# Patient Record
Sex: Female | Born: 1990 | Race: Black or African American | Hispanic: No | Marital: Married | Smoking: Never smoker
Health system: Southern US, Community
[De-identification: ages and names within clinical notes are randomized; demographics above are authoritative.]

## PROBLEM LIST (undated history)

## (undated) ENCOUNTER — Ambulatory Visit (HOSPITAL_COMMUNITY)

## (undated) DIAGNOSIS — K623 Rectal prolapse: Secondary | ICD-10-CM

## (undated) DIAGNOSIS — K602 Anal fissure, unspecified: Secondary | ICD-10-CM

## (undated) DIAGNOSIS — N979 Female infertility, unspecified: Secondary | ICD-10-CM

## (undated) DIAGNOSIS — Z8619 Personal history of other infectious and parasitic diseases: Secondary | ICD-10-CM

## (undated) DIAGNOSIS — I10 Essential (primary) hypertension: Secondary | ICD-10-CM

## (undated) HISTORY — DX: Essential (primary) hypertension: I10

## (undated) HISTORY — PX: RECTAL SURGERY: SHX760

## (undated) HISTORY — DX: Personal history of other infectious and parasitic diseases: Z86.19

## (undated) HISTORY — DX: Female infertility, unspecified: N97.9

---

## 2013-01-12 HISTORY — PX: MINOR HEMORRHOIDECTOMY: SHX6238

## 2013-01-20 DIAGNOSIS — K645 Perianal venous thrombosis: Secondary | ICD-10-CM | POA: Insufficient documentation

## 2017-03-15 ENCOUNTER — Emergency Department (HOSPITAL_COMMUNITY)
Admission: EM | Admit: 2017-03-15 | Discharge: 2017-03-15 | Disposition: A | Discharge status: Home / Self Care | Payer: Self-pay | Attending: Emergency Medicine | Admitting: Emergency Medicine

## 2017-03-15 ENCOUNTER — Encounter (HOSPITAL_COMMUNITY): Payer: Self-pay | Admitting: Emergency Medicine

## 2017-03-15 ENCOUNTER — Emergency Department (HOSPITAL_COMMUNITY): Payer: Self-pay

## 2017-03-15 ENCOUNTER — Other Ambulatory Visit: Payer: Self-pay

## 2017-03-15 DIAGNOSIS — K5900 Constipation, unspecified: Secondary | ICD-10-CM | POA: Insufficient documentation

## 2017-03-15 DIAGNOSIS — K602 Anal fissure, unspecified: Secondary | ICD-10-CM | POA: Insufficient documentation

## 2017-03-15 HISTORY — DX: Rectal prolapse: K62.3

## 2017-03-15 HISTORY — DX: Anal fissure, unspecified: K60.2

## 2017-03-15 LAB — COMPREHENSIVE METABOLIC PANEL
ALT: 15 U/L (ref 14–54)
AST: 18 U/L (ref 15–41)
Albumin: 3.7 g/dL (ref 3.5–5.0)
Alkaline Phosphatase: 54 U/L (ref 38–126)
Anion gap: 6 (ref 5–15)
BUN: <5 mg/dL — ABNORMAL LOW (ref 6–20)
CO2: 27 mmol/L (ref 22–32)
Calcium: 9.2 mg/dL (ref 8.9–10.3)
Chloride: 106 mmol/L (ref 101–111)
Creatinine, Ser: 0.82 mg/dL (ref 0.44–1.00)
GFR calc Af Amer: >60 mL/min (ref >60)
GFR calc non Af Amer: >60 mL/min (ref >60)
Glucose, Bld: 98 mg/dL (ref 65–99)
Potassium: 4 mmol/L (ref 3.5–5.1)
Sodium: 139 mmol/L (ref 135–145)
Total Bilirubin: 0.7 mg/dL (ref 0.3–1.2)
Total Protein: 6.7 g/dL (ref 6.5–8.1)

## 2017-03-15 LAB — CBC
HCT: 36.5 % (ref 36.0–46.0)
Hemoglobin: 12.4 g/dL (ref 12.0–15.0)
MCH: 31.6 pg (ref 26.0–34.0)
MCHC: 34 g/dL (ref 30.0–36.0)
MCV: 92.9 fL (ref 78.0–100.0)
Platelets: 243 10*3/uL (ref 150–400)
RBC: 3.93 MIL/uL (ref 3.87–5.11)
RDW: 12.1 % (ref 11.5–15.5)
WBC: 9 10*3/uL (ref 4.0–10.5)

## 2017-03-15 LAB — I-STAT BETA HCG BLOOD, ED (MC, WL, AP ONLY): I-stat hCG, quantitative: <5 m[IU]/mL (ref <5)

## 2017-03-15 MED ORDER — HYDROCORTISONE ACETATE 25 MG RE SUPP
25.0000 mg | Freq: Once | RECTAL | Status: AC
  Administered 2017-03-15: 25 mg via RECTAL
  Filled 2017-03-15: qty 1

## 2017-03-15 MED ORDER — SODIUM CHLORIDE 0.9 % IV BOLUS (SEPSIS)
1000.0000 mL | Freq: Once | INTRAVENOUS | Status: AC
  Administered 2017-03-15: 1000 mL via INTRAVENOUS

## 2017-03-15 MED ORDER — HYDROCORTISONE 2.5 % RE CREA: TOPICAL_CREAM | RECTAL | 1 refills | Status: AC

## 2017-03-15 MED ORDER — OXYCODONE-ACETAMINOPHEN 5-325 MG PO TABS
1.0000 | ORAL_TABLET | Freq: Once | ORAL | Status: AC
  Administered 2017-03-15: 1 via ORAL
  Filled 2017-03-15: qty 1

## 2017-03-15 MED ORDER — POLYETHYLENE GLYCOL 3350 17 G PO PACK: 17.0000 g | PACK | Freq: Every day | ORAL | 0 refills | Status: DC

## 2017-03-15 MED ORDER — MORPHINE SULFATE (PF) 4 MG/ML IV SOLN
4.0000 mg | Freq: Once | INTRAVENOUS | Status: AC
  Administered 2017-03-15: 4 mg via INTRAVENOUS
  Filled 2017-03-15: qty 1

## 2017-03-15 MED ORDER — TRAMADOL HCL 50 MG PO TABS: 50.0000 mg | ORAL_TABLET | Freq: Four times a day (QID) | ORAL | 0 refills | Status: AC | PRN

## 2017-03-15 MED ORDER — DOCUSATE SODIUM 100 MG PO CAPS
100.0000 mg | ORAL_CAPSULE | Freq: Once | ORAL | Status: AC
  Administered 2017-03-15: 100 mg via ORAL
  Filled 2017-03-15: qty 1

## 2017-03-15 NOTE — ED Notes (Signed)
Called radiology to follow up on xray, X-ray department states patient is near top of list to go for imagine.

## 2017-03-15 NOTE — ED Provider Notes (Signed)
[facility]   CSN: [id number] Arrival date & time: 03/15/17  0108    History   Chief Complaint Chief Complaint  Patient presents with  . Abdominal Pain  . Rectal Pain   HPI  Michelle Garrison is a 26 y.o. female presenting with abdominal and rectal pain.   She reports she had a hard time going to the bathroom yesterday around 10PM and took some magnesium.   Started having rectal bleeding which was painful and dark red, looked like clots.  Abdominal pain was sudden onset, localized to central-lower abdominal pain.  Pain does not radiate, is sharp in nature and 10/10 in severity.   Reports she is sometimes constipated and has BMs every 2-3 days.   Has not noted blood in her stool or on toilet tissue.  No difficulty urinating.   Denies fevers, chills, n/v, pelvic pain, no diarrhea.  Denies trauma to the rectum however has had this occurred once in the past about 3-4 years after she had rectal prolapse surgery.    Past Medical History:  Diagnosis Date  . Anal fissure   . Rectal prolapse    There are no active problems to display for this patient.  Past Surgical History:  Procedure Laterality Date  . RECTAL SURGERY     OB History    No data available     Home Medications    Prior to Admission medications   Not on File    Family History No family history on file.  Social History Social History   Tobacco Use  . Smoking status: Never Smoker  . Smokeless tobacco: Never Used  Substance Use Topics  . Alcohol use: No    Frequency: Never  . Drug use: No   Allergies   Patient has no known allergies.  Review of Systems Review of Systems  Constitutional: Negative for fatigue, fever and unexpected weight change.  Respiratory: Negative for cough, chest tightness, shortness of breath and wheezing.   Cardiovascular: Negative for chest pain, palpitations and leg swelling.  Gastrointestinal: Positive for abdominal pain, anal  bleeding, constipation and rectal pain. Negative for blood in stool, diarrhea and nausea.  Skin: Negative for rash.   Physical Exam Updated Vital Signs BP 123/83   Pulse 71   Temp 98.6 F (37 C)   Resp 16   Ht 5\' 4"  (1.626 m)   Wt 56.7 kg (125 lb)   LMP 03/01/2017 (Exact Date)   SpO2 98%   BMI 21.46 kg/m   Physical Exam Gen-26 year old female, mild distress, laying on her right side Skin -warm, dry HEENT- EOMI, moist mucous membranes, oropharynx is clear Neck - supple, no significant adenopathy Chest -clear to auscultation bilaterally, work of breathing is normal Heart -  RRR no MRG Abdomen -soft, nondistended, TTP over right and left lower quadrants, no rebound or guarding present, positive bowel sounds Rectal:  No evidence of active bleed or drainage from rectum,  No skin tears, abrasions or laceration noted, external hemorrhoid present, normal sphincter tone  Musculoskeletal -no edema  Neuro -awake, alert, oriented x3, no focal deficits  ED Treatments / Results  Labs (all labs ordered are listed, but only abnormal results are displayed) Labs Reviewed  COMPREHENSIVE METABOLIC PANEL - Abnormal; Notable for the following components:      Result Value   BUN <5 (*)    All other components within normal limits  CBC  I-STAT BETA HCG BLOOD, ED ([facility], [facility], [facility] ONLY)  EKG  EKG Interpretation None      Radiology Dg Abd Acute W/chest  Result Date: 03/15/2017 LOWER QUADRANT CLINICAL DATA: Pain associated with constipation for the past 2 days. History of rectal bleeding and rectal prolapse in the past. EXAM: DG ABDOMEN ACUTE W/ 1V CHEST COMPARISON:  None in PACs FINDINGS: The lungs are well-expanded and clear. The heart and mediastinal structures are normal. There is no pleural effusion. Within the abdomen there are numerous small air-fluid levels in both small and large bowel. No free extraluminal gas is observed. There is a moderate amount of stool in the rectum. There are no  abnormal soft tissue calcifications. The bony structures are unremarkable. IMPRESSION: No acute cardiopulmonary abnormality. Ileus versus mild distal colonic obstruction.  No perforation. Electronically Signed   By: David  SwazilandJordan M.D.   On: 03/15/2017 11:07    Procedures Procedures (including critical care time)  Medications Ordered in ED Medications  oxyCODONE-acetaminophen (PERCOCET/ROXICET) 5-325 MG per tablet 1 tablet (1 tablet Oral Given 03/15/17 0155)  sodium chloride 0.9 % bolus 1,000 mL (0 mLs Intravenous Stopped 03/15/17 1117)  morphine 4 MG/ML injection 4 mg (4 mg Intravenous Given 03/15/17 0908)  hydrocortisone (ANUSOL-HC) suppository 25 mg (25 mg Rectal Given 03/15/17 0908)  docusate sodium (COLACE) capsule 100 mg (100 mg Oral Given 03/15/17 1142)   Initial Impression / Assessment and Plan / ED Course  I have reviewed the triage vital signs and the nursing notes.  Pertinent labs & imaging results that were available during my care of the patient were reviewed by me and considered in my medical decision making (see chart for details).  26 year old female presenting with rectal pain and dark red bleeding in addition to lower abdominal pain and cramping.   In ED given Percocet for pain however still in a tremendous amount of pain. NS IVF and morphine started and Anusol for pain relief.  istat bhcg negative.   Unlikely rectal abscess given afebrile and white count within normal limits.  Labs unremarkable with stable hemoglobin.  Suspect her rectal pain is secondary to constipation vs anal fissure.  Chest and abdominal XR did not show any free gas or evidence of perforation.  There is a moderate amount of stool in the rectum.  She was given Colace in the ED for constipation and patient reported her symptoms slowly improving.   -Patient was sent home in stable condition with prescription for MiraLAX and Anusol -Tramadol tablets given for pain -Return precautions discussed  Final  Clinical Impressions(s) / ED Diagnoses   Final diagnoses:  None   ED Discharge Orders    None     Freddrick MarchYashika Olivya Sobol, MD [facility], PGY-2     Freddrick MarchAmin, Tamura Lasky, MD 03/15/17 1238    Charlynne PanderYao, David Hsienta, MD 03/17/17 1500

## 2017-03-15 NOTE — ED Triage Notes (Signed)
Pt c/o rectal pain and constipation.  Pt st's she has had a rectal prolapse before and this feels the same.  Pt also st's bleeding coming from rectum

## 2017-03-15 NOTE — Discharge Instructions (Signed)
You were seen in the ER for rectal pain and bleeding which is most likely due to constipation.  You were given medication for pain while here.  We ordered some imaging on your belly which showed stool in your colon possibly causing her symptoms.    I have prescribed some tramadol and Anusol for your pain.  Additionally you will need to use MiraLAX daily to help with bowel movements.   If you have new or worsening symptoms you will need to return to be evaluated.

## 2019-02-21 ENCOUNTER — Encounter: Payer: Self-pay | Admitting: Obstetrics and Gynecology

## 2019-02-21 ENCOUNTER — Ambulatory Visit (INDEPENDENT_AMBULATORY_CARE_PROVIDER_SITE_OTHER): Payer: Self-pay | Admitting: Obstetrics and Gynecology

## 2019-02-21 ENCOUNTER — Other Ambulatory Visit: Payer: Self-pay

## 2019-02-21 VITALS — BP 156/112 | HR 67 | Wt 151.0 lb

## 2019-02-21 DIAGNOSIS — I10 Essential (primary) hypertension: Secondary | ICD-10-CM

## 2019-02-21 MED ORDER — AMLODIPINE BESYLATE 10 MG PO TABS: 10.0000 mg | ORAL_TABLET | Freq: Every day | ORAL | 1 refills | Status: DC

## 2019-02-21 NOTE — Progress Notes (Signed)
Attempting to conceive for a year or 2

## 2019-02-21 NOTE — Progress Notes (Deleted)
GYNECOLOGY ANNUAL PREVENTATIVE CARE ENCOUNTER NOTE  History:     Michelle Garrison is a 28 y.o. No obstetric history on file. female here for a routine annual gynecologic exam.  Current complaints: unable to conceive.    Denies abnormal vaginal bleeding, discharge, pelvic pain, problems with intercourse or other gynecologic concerns.    Has a period every month, however periods are irregular. Comes once a month however could come every 20 days to every 35 days. At times she is 2-3 months late. Has been trying to conceive for 2 years. Has regular intercourse. Has no pregnancies.    Gynecologic History No LMP recorded. (Menstrual status: Irregular Periods). Contraception: {method:5051} Last Pap: ***. Results were: {norm/abn:16337} with negative HPV Last mammogram: ***. Results were: {norm/abn:16337}  Obstetric History OB History  No obstetric history on file.    Past Medical History:  Diagnosis Date  . Anal fissure   . Rectal prolapse     Past Surgical History:  Procedure Laterality Date  . RECTAL SURGERY      Current Outpatient Medications on File Prior to Visit  Medication Sig Dispense Refill  . polyethylene glycol (MIRALAX) packet Take 17 g daily by mouth. (Patient not taking: Reported on 02/21/2019) 14 each 0   No current facility-administered medications on file prior to visit.     No Known Allergies  Social History:  reports that she has never smoked. She has never used smokeless tobacco. She reports that she does not drink alcohol or use drugs.  History reviewed. No pertinent family history.  The following portions of the patient's history were reviewed and updated as appropriate: allergies, current medications, past family history, past medical history, past social history, past surgical history and problem list.  Review of Systems Pertinent items noted in HPI and remainder of comprehensive ROS otherwise negative.  Physical Exam:  BP (!) 156/112   Pulse 67    Wt 151 lb (68.5 kg)   BMI 25.92 kg/m  CONSTITUTIONAL: Well-developed, well-nourished female in no acute distress.  HENT:  Normocephalic, atraumatic, External right and left ear normal. Oropharynx is clear and moist EYES: Conjunctivae and EOM are normal. Pupils are equal, round, and reactive to light. No scleral icterus.  NECK: Normal range of motion, supple, no masses.  Normal thyroid.  SKIN: Skin is warm and dry. No rash noted. Not diaphoretic. No erythema. No pallor. MUSCULOSKELETAL: Normal range of motion. No tenderness.  No cyanosis, clubbing, or edema.  2+ distal pulses. NEUROLOGIC: Alert and oriented to person, place, and time. Normal reflexes, muscle tone coordination. No cranial nerve deficit noted. PSYCHIATRIC: Normal mood and affect. Normal behavior. Normal judgment and thought content. CARDIOVASCULAR: Normal heart rate noted, regular rhythm RESPIRATORY: Clear to auscultation bilaterally. Effort and breath sounds normal, no problems with respiration noted. BREASTS: Symmetric in size. No masses, skin changes, nipple drainage, or lymphadenopathy. ABDOMEN: Soft, normal bowel sounds, no distention noted.  No tenderness, rebound or guarding.  PELVIC: Normal appearing external genitalia; normal appearing vaginal mucosa and cervix.  No abnormal discharge noted.  Pap smear obtained.  Normal uterine size, no other palpable masses, no uterine or adnexal tenderness.   Assessment and Plan:    There are no diagnoses linked to this encounter. Will follow up results of pap smear and manage accordingly. Mammogram scheduled Routine preventative health maintenance measures emphasized. Please refer to After Visit Summary for other counseling recommendations.      Verita Schneiders, MD, [facility], [facility]

## 2019-02-21 NOTE — Progress Notes (Signed)
Patient here to discuss infertility. She is a new patient. She has no health insurance and plans to pay out of pocket She is not due for a pap smear or annual BP elevated 156/112. Patient with multiple episodes of elevated BP per patient. No PCP.  Patient referred to Dr. Annice Needy aware of the costs.  Norvasc RX: 10 mg daily She needs a PCP  No labs collected  GENERAL: Well-developed, well-nourished female in no acute distress.  LUNGS: Effort normal SKIN: Warm, dry and without erythema PSYCH: Normal mood and affect   Michelle Garrison, Artist Pais, NP 02/21/2019 3:29 PM

## 2019-04-25 ENCOUNTER — Emergency Department (HOSPITAL_COMMUNITY)
Admission: EM | Admit: 2019-04-25 | Discharge: 2019-04-25 | Disposition: A | Discharge status: Home / Self Care | Payer: Self-pay | Attending: Emergency Medicine | Admitting: Emergency Medicine

## 2019-04-25 ENCOUNTER — Ambulatory Visit (HOSPITAL_COMMUNITY)
Admission: EM | Admit: 2019-04-25 | Discharge: 2019-04-25 | Disposition: A | Discharge status: Home / Self Care | Payer: Self-pay | Attending: Family Medicine | Admitting: Family Medicine

## 2019-04-25 ENCOUNTER — Emergency Department (HOSPITAL_COMMUNITY): Payer: Self-pay

## 2019-04-25 ENCOUNTER — Encounter (HOSPITAL_COMMUNITY): Payer: Self-pay

## 2019-04-25 ENCOUNTER — Other Ambulatory Visit: Payer: Self-pay

## 2019-04-25 DIAGNOSIS — R0603 Acute respiratory distress: Secondary | ICD-10-CM

## 2019-04-25 DIAGNOSIS — Z79899 Other long term (current) drug therapy: Secondary | ICD-10-CM | POA: Insufficient documentation

## 2019-04-25 DIAGNOSIS — R0600 Dyspnea, unspecified: Secondary | ICD-10-CM

## 2019-04-25 DIAGNOSIS — R0602 Shortness of breath: Secondary | ICD-10-CM | POA: Insufficient documentation

## 2019-04-25 DIAGNOSIS — I10 Essential (primary) hypertension: Secondary | ICD-10-CM | POA: Insufficient documentation

## 2019-04-25 DIAGNOSIS — R0781 Pleurodynia: Secondary | ICD-10-CM | POA: Insufficient documentation

## 2019-04-25 DIAGNOSIS — R071 Chest pain on breathing: Secondary | ICD-10-CM

## 2019-04-25 LAB — COMPREHENSIVE METABOLIC PANEL
ALT: 18 U/L (ref 0–44)
AST: 18 U/L (ref 15–41)
Albumin: 4.1 g/dL (ref 3.5–5.0)
Alkaline Phosphatase: 46 U/L (ref 38–126)
Anion gap: 9 (ref 5–15)
BUN: 6 mg/dL (ref 6–20)
CO2: 21 mmol/L — ABNORMAL LOW (ref 22–32)
Calcium: 8.7 mg/dL — ABNORMAL LOW (ref 8.9–10.3)
Chloride: 108 mmol/L (ref 98–111)
Creatinine, Ser: 0.86 mg/dL (ref 0.44–1.00)
GFR calc Af Amer: >60 mL/min (ref >60)
GFR calc non Af Amer: >60 mL/min (ref >60)
Glucose, Bld: 103 mg/dL — ABNORMAL HIGH (ref 70–99)
Potassium: 4.2 mmol/L (ref 3.5–5.1)
Sodium: 138 mmol/L (ref 135–145)
Total Bilirubin: 0.6 mg/dL (ref 0.3–1.2)
Total Protein: 6.8 g/dL (ref 6.5–8.1)

## 2019-04-25 LAB — LIPASE, BLOOD: Lipase: 28 U/L (ref 11–51)

## 2019-04-25 LAB — CBC
HCT: 36.5 % (ref 36.0–46.0)
Hemoglobin: 12.6 g/dL (ref 12.0–15.0)
MCH: 32.2 pg (ref 26.0–34.0)
MCHC: 34.5 g/dL (ref 30.0–36.0)
MCV: 93.4 fL (ref 80.0–100.0)
Platelets: 239 10*3/uL (ref 150–400)
RBC: 3.91 MIL/uL (ref 3.87–5.11)
RDW: 12.4 % (ref 11.5–15.5)
WBC: 6 10*3/uL (ref 4.0–10.5)
nRBC: 0 % (ref 0.0–0.2)

## 2019-04-25 LAB — TROPONIN I (HIGH SENSITIVITY): Troponin I (High Sensitivity): <2 ng/L (ref <18)

## 2019-04-25 LAB — I-STAT BETA HCG BLOOD, ED (MC, WL, AP ONLY): I-stat hCG, quantitative: <5 m[IU]/mL (ref <5)

## 2019-04-25 MED ORDER — METHOCARBAMOL 500 MG PO TABS: 500.0000 mg | ORAL_TABLET | Freq: Two times a day (BID) | ORAL | 0 refills | Status: DC

## 2019-04-25 MED ORDER — IOHEXOL 350 MG/ML SOLN
100.0000 mL | Freq: Once | INTRAVENOUS | Status: AC | PRN
  Administered 2019-04-25: 100 mL via INTRAVENOUS

## 2019-04-25 MED ORDER — METHOCARBAMOL 500 MG PO TABS
750.0000 mg | ORAL_TABLET | Freq: Once | ORAL | Status: AC
  Administered 2019-04-25: 18:00:00 750 mg via ORAL
  Filled 2019-04-25: qty 2

## 2019-04-25 MED ORDER — SODIUM CHLORIDE 0.9 % IV BOLUS
1000.0000 mL | Freq: Once | INTRAVENOUS | Status: AC
  Administered 2019-04-25: 1000 mL via INTRAVENOUS

## 2019-04-25 MED ORDER — MORPHINE SULFATE (PF) 4 MG/ML IV SOLN
4.0000 mg | Freq: Once | INTRAVENOUS | Status: AC
  Administered 2019-04-25: 4 mg via INTRAVENOUS
  Filled 2019-04-25: qty 1

## 2019-04-25 NOTE — ED Triage Notes (Signed)
Pt from UC with ems for evaluation of acute onset SOB and right flank pain. Pain worse with deep inspiration. UC believes she may have a possible pneumothorax or PE. Pt alert, resp unlabored at this time

## 2019-04-25 NOTE — ED Notes (Signed)
Patient is being discharged from the Urgent [place] and sent to the Emergency Department via [place]. Per Molli Barrows, FNP, patient is stable but in need of higher level of care due to sudden onset severe right sided chest/abdominal pain that is worse w/ inspiration. Concern for PE. Patient is aware and verbalizes understanding of plan of care. Pt also states there is a chance she could be pregnant, pt only able to sit in tripod position due to severe pain. Vitals:   04/25/19 1357 04/25/19 1405  BP: (!) 160/113   Pulse: 88 97  Resp: (!) 26   Temp: 98.6 F (37 C)   SpO2: 100% 95%

## 2019-04-25 NOTE — ED Provider Notes (Addendum)
[facility] EMERGENCY DEPARTMENT Provider Note   CSN: [id number] Arrival date & time: 04/25/19  1441     History Chief Complaint  Patient presents with  . Chest Pain  . Shortness of Breath  . Pleurisy    Michelle Garrison is a 28 y.o. female with no significant past medical history other than hypertension treated with amlodipine  HPI Patient is 28 year old female presented today with acute onset of shortness of breath and chest pain that is pleuritic, nonradiating, severe, 10/10, worse with deep breaths and leaning forward.  Patient denies any symptoms prior to the acute onset this morning.  States that began around 12:45 PM when she suddenly couch.  States the pain is well localized and sharp directly underneath her right breast.  States that it hurts to push on the area but does not recreate the same pain.  Patient denies any diaphoresis, lightheadedness, nausea, abdominal pain.  Patient has any recent illness cough, fever, congestion.  States she was feeling quite well prior to episode.  States that her dyspnea has improved slightly but she still has pleuritic pain.  Patient was seen in urgent care and sent to ED for concern for PE versus pneumothorax.  Patient has a history of clots, no hemoptysis, no active cancer, no leg swelling, no leg tenderness.  Patient is not diabetic, has treated hypertension, non-smoker, no family with heart attacks under 32 years old.   HPI: A 28 year old patient with a history of hypertension presents for evaluation of chest pain. Initial onset of pain was approximately 3-6 hours ago. The patient's chest pain is well-localized, is sharp and is not worse with exertion. The patient's chest pain is not middle- or left-sided, is not described as heaviness/pressure/tightness and does not radiate to the arms/jaw/neck. The patient does not complain of nausea and denies diaphoresis. The patient has no history of stroke, has no history of peripheral  artery disease, has not smoked in the past 90 days, denies any history of treated diabetes, has no relevant family history of coronary artery disease (first degree relative at less than age 55), has no history of hypercholesterolemia and does not have an elevated BMI (>=30).   Past Medical History:  Diagnosis Date  . Anal fissure   . Rectal prolapse     There are no problems to display for this patient.   Past Surgical History:  Procedure Laterality Date  . RECTAL SURGERY       OB History   No obstetric history on file.     Family History  Problem Relation Age of Onset  . Diabetes Mother   . Healthy Father     Social History   Tobacco Use  . Smoking status: Never Smoker  . Smokeless tobacco: Never Used  Substance Use Topics  . Alcohol use: No  . Drug use: No    Home Medications Prior to Admission medications   Medication Sig Start Date End Date Taking? Authorizing Provider  amLODipine (NORVASC) 10 MG tablet Take 1 tablet (10 mg total) by mouth daily. 02/21/19   Rasch, Victorino Dike I, NP  polyethylene glycol (MIRALAX) packet Take 17 g daily by mouth. Patient not taking: Reported on 02/21/2019 03/15/17   Freddrick March, MD    Allergies    Patient has no known allergies.  Review of Systems   Review of Systems  Constitutional: Negative for chills and fever.  HENT: Negative for congestion.   Eyes: Negative for pain.  Respiratory: Positive for shortness of breath.  Negative for cough and chest tightness.   Cardiovascular: Positive for chest pain. Negative for leg swelling.  Gastrointestinal: Negative for abdominal pain and vomiting.  Genitourinary: Negative for dysuria.  Musculoskeletal: Negative for myalgias.  Skin: Negative for rash.  Neurological: Negative for dizziness and headaches.    Physical Exam Updated Vital Signs BP (!) 159/99   Pulse 78   Temp 98.6 F (37 C) (Oral)   Resp 20   SpO2 100%   Physical Exam Vitals and nursing note reviewed.   Constitutional:      General: She is not in acute distress. HENT:     Head: Normocephalic and atraumatic.     Nose: Nose normal.  Eyes:     General: No scleral icterus. Cardiovascular:     Rate and Rhythm: Normal rate and regular rhythm.     Pulses: Normal pulses.     Heart sounds: Normal heart sounds.  Pulmonary:     Effort: Pulmonary effort is normal. No respiratory distress.     Breath sounds: No wheezing.     Comments: Patient respiratory rate of 24.  No increased work of breathing.  Speaking in full sentences.  Tidal volume grossly shallow. Abdominal:     Palpations: Abdomen is soft.     Tenderness: There is no abdominal tenderness.  Musculoskeletal:     Cervical back: Normal range of motion.     Right lower leg: No edema.     Left lower leg: No edema.     Comments: No reproducible chest wall tenderness to palpation  No leg swelling.  No tenderness to palpation of calves.  Negative Homans' sign bilaterally.   Skin:    General: Skin is warm and dry.     Capillary Refill: Capillary refill takes less than 2 seconds.  Neurological:     Mental Status: She is alert. Mental status is at baseline.     Motor: No weakness.     Comments: Moves all 4 limbs symmetric strength  Psychiatric:        Mood and Affect: Mood normal.        Behavior: Behavior normal.     ED Results / Procedures / Treatments   Labs (all labs ordered are listed, but only abnormal results are displayed) Labs Reviewed  COMPREHENSIVE METABOLIC PANEL - Abnormal; Notable for the following components:      Result Value   CO2 21 (*)    Glucose, Bld 103 (*)    Calcium 8.7 (*)    All other components within normal limits  LIPASE, BLOOD  CBC  I-STAT BETA HCG BLOOD, ED ([facility], [facility], [facility] ONLY)  TROPONIN I (HIGH SENSITIVITY)    EKG None  ED ECG REPORT   Date: 04/25/2019  Rate: 78 bpm  Rhythm: normal sinus rhythm  QRS Axis: normal  Intervals: normal  ST/T Wave abnormalities: nonspecific T wave changes   Conduction Disutrbances:none  Narrative Interpretation:   Old EKG Reviewed: none available  I have personally reviewed the EKG tracing and agree with the computerized printout as noted.   Radiology DG Chest Portable 1 View  Result Date: 04/25/2019 CLINICAL DATA:  Shortness of breath, chest pain EXAM: PORTABLE CHEST 1 VIEW COMPARISON:  Portable exam 1501 hours without priors for comparison FINDINGS: Normal heart size, mediastinal contours, and pulmonary vascularity. Lungs clear. No pleural effusion or pneumothorax. Bones unremarkable. IMPRESSION: Normal exam. Electronically Signed   By: Ulyses Southward M.D.   On: 04/25/2019 15:17    Procedures Procedures (including critical  care time)  Medications Ordered in ED Medications  morphine 4 MG/ML injection 4 mg (has no administration in time range)  methocarbamol (ROBAXIN) tablet 750 mg (750 mg Oral Given 04/25/19 1827)    ED Course  I have reviewed the triage vital signs and the nursing notes.  Pertinent labs & imaging results that were available during my care of the patient were reviewed by me and considered in my medical decision making (see chart for details).  Clinical Course as of Apr 25 1911  Thu Apr 25, 2019  1741 Chest x-ray personally viewed by myself no evidence of pneumothorax, no focal infiltrate, no Hampton hump  DG Chest Portable 1 View [WF]    Clinical Course User Index [WF] Tedd Sias, [place]   MDM Rules/Calculators/A&P HEAR Score: 2                    Patient is a healthy 28 year old female presented today with acute onset of pleuritic chest pain that is nonradiating, right-sided, constant, worse with breathing and leaning forward.  Physical exam unremarkable.  Given history of physical exam primarily concern for muscular strain, pericarditis or pulmonary embolism.  Patient is moderate risk Wells criteria due to pulmonary embolism being #1 diagnosis or equally likely to alternative diagnosis.  EKG shows no  indications of pericarditis and patient does not have any recent history of URI/viral illness.  hCG negative.  CBC within normal limits.  Lipase within normal limits.  CMP remarkable for minimally decreased CO2 consistent with mild tachypnea. Troponin x1 negative as it has been >6 hours and hsTrop is negative with HEART score < 3 will not order repeat troponin per CP algorithm.   EKG personally reviewed by myself.  Regular rate and rhythm, no PR shortening or lengthening.  There are normal intervals.  Normal axis.  Normal R wave progression.  No ST elevation, PR depression, or acute ischemic changes.  No reference EKG to compare to.  Isolated T wave inversions in V1 likely normal variant but could be indicative of right heart strain.  Chest x-ray personally reviewed myself shows no focal infiltrate, pneumothorax, no Hampton hump.  No indication of acute abnormality.  Primary concern is pulmonary embolism for this reason and due to moderate Wells criteria will obtain pulmonary embolism CT scan.  Shared decision making conversation with patient discussing using a D-dimer versus pulmonary embolism CT scan.  Patient preferred to have definitive test done today as she is very concerned for blood clot.  On reevaluation patient is still experiencing 10/10 CP with breathing. Will give 4mg  of morphine while waiting for CT scan.   The emergent causes of chest pain include: Acute coronary syndrome, tamponade, pericarditis/myocarditis, aortic dissection, pulmonary embolism, tension pneumothorax, pneumonia, and esophageal rupture.  I do not believe the patient has an emergent cause of chest pain, other urgent/non-acute considerations include, but are not limited to: chronic angina, aortic stenosis, cardiomyopathy, mitral valve prolapse, pulmonary hypertension, aortic insufficiency, right ventricular hypertrophy, pleuritis, bronchitis, pneumothorax, tumor, gastroesophageal reflux disease (GERD), esophageal spasm,  Mallory-Weiss syndrome, peptic ulcer disease, pancreatitis, functional gastrointestinal pain, cervical or thoracic disk disease or arthritis, shoulder arthritis, costochondritis, subacromial bursitis, anxiety or panic attack, herpes zoster, breast disorders, chest wall tumors, thoracic outlet syndrome, mediastinitis.  As this is likely musculoskeletal pain I counseled patient on using Robaxin and recommended Tylenol ibuprofen as well as rest relax and take warm salt water soaks.  Will recommend gentle stretching as well.  Patient given strict return precautions.  CT scan pending at time of shift change. 7:12 PM patient signed out to Terance HartKelly Gekas, PA-C who will reevaluate patient after CTPA and appropriately dispo.    Final Clinical Impression(s) / ED Diagnoses Final diagnoses:  Pleuritic chest pain    Rx / DC Orders ED Discharge Orders    None       Gailen ShelterFondaw, Aundra Espin S, [place] 04/25/19 1912    Gailen ShelterFondaw, Deneice Wack S, [place] 04/25/19 Jacqlyn [id number]    Haviland, Julie, MD 04/25/19 1941

## 2019-04-25 NOTE — Discharge Instructions (Signed)
Your examination today is most concerning for a muscular injury 1. Medications: alternate ibuprofen and tylenol for pain control, take all usual home medications as they are prescribed 2. Follow Up: If your symptoms do not improve please follow up with orthopedics/sports medicine or your PCP for discussion of your diagnoses and further evaluation after today's visit; if you do not have a primary care doctor use the resource guide provided to find one; Please return to the ER for worsening symptoms or other concerns.    Please return to ED if you have any new concerning symptoms.

## 2019-04-25 NOTE — ED Notes (Signed)
Patient transported to CT 

## 2019-04-25 NOTE — ED Provider Notes (Signed)
28 year old female with acute onset of CP/SOB. R sided and constant since this morning. At shift change CTA chest is pending.  CTA chest is negative. Work up reviewed. EKG is SR. Labs are reassuring. Trop is normal. CXR is negative. Will treat as MSK pain. Rx for Robaxin given.   Recardo Evangelist, PA-C 04/25/19 2042    Margette Fast, MD 04/26/19 [phone]

## 2019-04-25 NOTE — ED Triage Notes (Addendum)
Patient presents to Urgent Care with complaints of sudden and severe right abdominal pain from her breast down to her pelvis since about 30 mins ago. Patient reports this has not happened in the past, no cardiac hx, pt is barely able to stand to transfer from wheelchair to exam table. BP 160/113, HR 99, O2 100, temp 98.6.  Pt has not eaten today, also states she has not taken her BP medication in about a week. Pt's pain is 10/10 during deep inspiration, APP bedside for eval, recommending transport to ED for eval of possible pneumothorax.

## 2019-04-25 NOTE — ED Notes (Signed)
No answer for room x2 

## 2019-04-25 NOTE — ED Notes (Signed)
Pt was in restroom when called

## 2019-04-25 NOTE — ED Notes (Signed)
Patient verbalizes understanding of discharge instructions. Opportunity for questioning and answers were provided. Armband removed by staff, pt discharged from ED ambulatory to home.  

## 2019-04-25 NOTE — ED Provider Notes (Signed)
[place]    CSN: [id number] Arrival date & time: 04/25/19  1350      History   Chief Complaint Chief Complaint  Patient presents with   Chest Pain (Right)  . Shortness of Breath    HPI Michelle Garrison is a 28 y.o. female.   HPI Patient presents to urgent care with acute onset of right sided chest pain which she reports occurred suddenly approximately 30-40 minutes ago. Current pain level she reports is a 10/10 pain with inspiratory breathing or leaning back. Pain improves with flexing forward. She denies coughing or recent illness. Medical history is only significant for hypertension and she has not taken blood pressure medication in about 1 week. Prior to 30 minutes ago, patient was well and denies any concerning symptoms. Patient is shaking uncontrollable and continues to state " I can't breath". Oxygen level is normal and briefly desaturated into upper 60% although immediately rebounded to mid 90% as patient appeared to be holding breath as she was complaining of severe pain. No history of clotting disorder or prior PE. Pt reports possible pregnancy as menstrual cycle is late. No history of complicated pregnancy.Patient is tachpyneic and endorses feeling as if she is going to pass out. EMS has been called. Provider is at bedside and RN starting IV. Patient placed on 2 liters of oxygen, ECG NSR without present ST Elevation or depression.   Past Medical History:  Diagnosis Date  . Anal fissure   . Rectal prolapse     There are no problems to display for this patient.   Past Surgical History:  Procedure Laterality Date  . RECTAL SURGERY      OB History   No obstetric history on file.      Home Medications    Prior to Admission medications   Medication Sig Start Date End Date Taking? Authorizing Provider  amLODipine (NORVASC) 10 MG tablet Take 1 tablet (10 mg total) by mouth daily. 02/21/19  Yes Rasch, Anderson Malta I, NP  polyethylene glycol (MIRALAX)  packet Take 17 g daily by mouth. Patient not taking: Reported on 02/21/2019 03/15/17   Lovenia Kim, MD    Family History Family History  Problem Relation Age of Onset  . Diabetes Mother   . Healthy Father     Social History Social History   Tobacco Use  . Smoking status: Never Smoker  . Smokeless tobacco: Never Used  Substance Use Topics  . Alcohol use: No  . Drug use: No     Allergies   Patient has no known allergies.   Review of Systems Review of Systems Pertinent negatives listed in HPI  Physical Exam Triage Vital Signs ED Triage Vitals  Enc Vitals Group     BP 04/25/19 1357 (!) 160/113     Pulse Rate 04/25/19 1357 88     Resp 04/25/19 1357 (!) 26     Temp 04/25/19 1357 98.6 F (37 C)     Temp Source 04/25/19 1357 Oral     SpO2 04/25/19 1357 100 %     Weight --      Height --      Head Circumference --      Peak Flow --      Pain Score 04/25/19 1359 9     Pain Loc --      Pain Edu? --      Excl. in [place]? --    No data found.  Updated Vital Signs BP (!) 160/113 (BP Location: Right  Arm)   Pulse 97   Temp 98.6 F (37 C) (Oral)   Resp (!) 26   LMP 03/24/2019 (Exact Date)   SpO2 95%   Visual Acuity Right Eye Distance:   Left Eye Distance:   Bilateral Distance:    Right Eye Near:   Left Eye Near:    Bilateral Near:     Physical Exam General appearance: Acute distress present Head: Normocephalic, without obvious abnormality, atraumatic Respiratory: Respirations labored, breath sound normal, negative adventitious breath sounds,  tachypnea present. Heart: Rhythm normal, intermittent tachycardia rate>100, nonsustained, HR base line 94-97 BPM  . No gallop or murmurs noted on exam. Negative for BLE edema. Right chest wall pain non-reproducible  Abdomen: BS +, no distention, no rebound tenderness, or no mass Extremities: No gross deformities Skin: Skin color, texture, turgor normal. Psych: Anxiousness  Neurologic: Generalized tremulous present.  Alert, oriented to person, place, and time,  UC Treatments / Results  Labs (all labs ordered are listed, but only abnormal results are displayed) Labs Reviewed - No data to display  EKG   Radiology No results found.  Procedures Procedures (including critical care time)  Medications Ordered in UC Medications  sodium chloride 0.9 % bolus 1,000 mL (1,000 mLs Intravenous Transfusing/Transfer 04/25/19 1419)    Initial Impression / Assessment and Plan / UC Course  I have reviewed the triage vital signs and the nursing notes.  Pertinent labs & imaging results that were available during my care of the patient were reviewed by me and considered in my medical decision making (see chart for details).    Patient discharge via EMS given complexity of patients symptoms and acute onset of respiratory distress involving 10/10 chest wall pain, symptoms are concerning for a possible pulmonary embolism or Pneumothorax. Given patient's severity of symptom additional work-up indicated to rule out the presence of a possible life-threatening event. EMS arrived, report provided to EMT, Triaged nurse called report to ED, patient care transitioned to EMS team. Patient is alert, continues to experience distress, although remains alert and oriented x 3. Final Clinical Impressions(s) / UC Diagnoses   Final diagnoses:  Acute respiratory distress  Dyspnea, unspecified type  Chest pain on breathing     Discharge Instructions     Patient discharged emergently via EMS for evaluation in the ER for possible pul    ED Prescriptions    None     PDMP not reviewed this encounter.   Bing Neighbors, [place] 04/27/19 2237

## 2019-04-25 NOTE — Discharge Instructions (Signed)
Patient discharged emergently via EMS for evaluation in the ER for possible pul

## 2019-11-08 ENCOUNTER — Other Ambulatory Visit: Payer: Self-pay | Admitting: Obstetrics

## 2019-11-08 DIAGNOSIS — N926 Irregular menstruation, unspecified: Secondary | ICD-10-CM

## 2019-11-08 DIAGNOSIS — Z3149 Encounter for other procreative investigation and testing: Secondary | ICD-10-CM

## 2019-11-14 ENCOUNTER — Ambulatory Visit
Admission: RE | Admit: 2019-11-14 | Discharge: 2019-11-14 | Disposition: A | Discharge status: Home / Self Care | Payer: 59 | Source: Ambulatory Visit | Attending: Obstetrics | Admitting: Obstetrics

## 2019-11-14 DIAGNOSIS — N926 Irregular menstruation, unspecified: Secondary | ICD-10-CM

## 2019-11-14 DIAGNOSIS — Z3149 Encounter for other procreative investigation and testing: Secondary | ICD-10-CM

## 2020-05-02 NOTE — L&D Delivery Note (Signed)
Delivery Note At 1:59 AM a viable and healthy female was delivered via Vaginal, Spontaneous (Presentation: Right Occiput Anterior).  APGAR: 9, 9 ; weight 3 lb 9.9 oz (1640 g).   Placenta status: Spontaneous, Intact.  Cord:  3 vessel with the following complications: none .    Anesthesia: Epidural Episiotomy: None Lacerations:  None, intact Suture Repair:  n/a Est. Blood Loss (mL): 100  Mom to postpartum.  Baby to NICU.  Michelle Garrison is a 30 y.o. female G1P0000 with IUP at [redacted]w[redacted]d admitted for IOL for severe FGR, size <1.3% tile, CHTN, elevated dopplers, suspected T21.  She progressed with augmentation to complete and pushed with one contraction to deliver.  Cord clamping delayed by 1 minute then clamped by CNM and cut by father of the baby.  Placenta intact and spontaneous, bleeding minimal.  Intact perineum, no repair.  Patient and baby stable.  Infant transferred to NICU for preterm and low birth weight.  Patient to postpartum.  She plans on breastfeeding. She declines birth control.   Michelle Garrison 10/31/2020, 2:31 AM

## 2020-05-05 ENCOUNTER — Encounter: Payer: Self-pay | Admitting: General Practice

## 2020-05-18 ENCOUNTER — Encounter: Payer: 59 | Admitting: Obstetrics & Gynecology

## 2020-05-18 ENCOUNTER — Encounter: Payer: 59 | Admitting: Family Medicine

## 2020-05-26 ENCOUNTER — Other Ambulatory Visit: Payer: Self-pay

## 2020-05-26 ENCOUNTER — Telehealth (INDEPENDENT_AMBULATORY_CARE_PROVIDER_SITE_OTHER): Payer: 59 | Admitting: *Deleted

## 2020-05-26 DIAGNOSIS — Z3A00 Weeks of gestation of pregnancy not specified: Secondary | ICD-10-CM

## 2020-05-26 DIAGNOSIS — O0990 Supervision of high risk pregnancy, unspecified, unspecified trimester: Secondary | ICD-10-CM

## 2020-05-26 DIAGNOSIS — N979 Female infertility, unspecified: Secondary | ICD-10-CM

## 2020-05-26 DIAGNOSIS — O10919 Unspecified pre-existing hypertension complicating pregnancy, unspecified trimester: Secondary | ICD-10-CM | POA: Insufficient documentation

## 2020-05-26 NOTE — Patient Instructions (Addendum)
- At our [facility], we work as an integrated team, providing care to address both physical and emotional health. Your medical provider may refer you to see our Behavioral Health Clinician (BHC) on the same day you see your medical provider, as availability permits; often scheduled virtually at your convenience.  Our BHC is available to all patients, visits generally last between 20-30 minutes, but can be longer or shorter, depending on patient need. The BHC offers help with stress management, coping with symptoms of depression and anxiety, major life changes , sleep issues, changing risky behavior, grief and loss, life stress, working on personal life goals, and  behavioral health issues, as these all affect your overall health and wellness.  The BHC is NOT available for the following: FMLA paperwork, court-ordered evaluations, specialty assessments (custody or disability), letters to employers, or obtaining certification for an emotional support animal. The BHC does not provide long-term therapy. You have the right to refuse integrated behavioral health services, or to reschedule to see the BHC at a later date.  Confidentiality exception: If it is suspected that a child or disabled adult is being abused or neglected, we are required by law to report that to either Child Protective Services or Adult Protective Services.  If you have a diagnosis of Bipolar affective disorder, Schizophrenia, or recurrent Major depressive disorder, we will recommend that you establish care with a psychiatrist, as these are lifelong, chronic conditions, and we want your overall emotional health and medications to be more closely monitored. If you anticipate needing extended maternity leave due to mental health issues postpartum, it it recommended you inform your medical provider, so we can put in a referral to a  psychiatrist as soon as possible. The BHC is unable to recommend an extended maternity leave for mental  health issues. Your medical provider or BHC may refer you to a therapist for ongoing, traditional therapy, or to a psychiatrist, for medication management, if it would benefit your overall health. Depending on your insurance, you may have a copay to see the BHC. If you are uninsured, it is recommended that you apply for financial assistance. (Forms may be requested at the front desk for in-person visits, via MyChart, or request a form during a virtual visit).  If you see the BHC more than 6 times, you will have to complete a comprehensive clinical assessment interview with the BHC to resume integrated services.  For virtual visits with the BHC, you must be physically in the state of [place] at the time of the visit. For example, if you live in [place], you will have to do an in-person visit with the BHC, and your out-of-state insurance may not cover behavioral health services in [place]. f you are going out of the state or country for any reason, the BHC may see you virtually when you return to [place], but not while you are physically outside of [place].     Meet the Provider Zoom Sessions      [facility] is now offering FREE monthly 1-hour virtual Zoom sessions for new, current, and prospective patients.        During these sessions, you can:   Learn about our practice, model of care, services   Get answers to questions about pregnancy and birth during COVID   Pick your provider's brain about anything else!    Sessions will be hosted by [facility] Nurse Practitioners, Physician Assistants, Physicians and Midwives            No registration required      2021 Dates:      All at 6pm     October 21st     November 18th   December 16th     January 20th  February 17th    To join one of these meetings, a few minutes before it is set to start:     Copy/paste the link into your web  browser:  [email or web address]    OR  Scan the QR code below (open up your camera and point towards QR code; click on tab that pops up on your phone ("zoom")    

## 2020-05-26 NOTE — Progress Notes (Signed)
New OB Intake  I connected with  Jerene Pitch on 05/26/20 at 9:20 by MyChart and verified that I am speaking with the correct person using two identifiers. Nurse is located at [facility] and pt is located at home.  I discussed the limitations, risks, security and privacy concerns of performing an evaluation and management service by telephone and the availability of in person appointments. I also discussed with the patient that there may be a patient responsible charge related to this service. The patient expressed understanding and agreed to proceed.  I explained I am completing New OB Intake today. She started care and is transferring from [facility] due to insurance issues. We discussed her EDD of 12/02/20 that is based on LMP of 02/26/20. Pt is G1/P0. I reviewed her allergies, medications, Medical/Surgical/OB history, and appropriate screenings. I informed her of [facility] services. Based on history, this is a/an complicated by  History of CHTN( stopped norvasc 12/2019) and history of infertility  Pregnancy. Stattes completed 1 IUI treatment but got pregnant on her own.   Concerns addressed today  Delivery Plans:  Plans to deliver at [facility].   MyChart/Babyscripts MyChart access verified. I explained pt will have some visits in office and some virtually. Babyscripts instructions given.  Blood Pressure Cuff Patient has a bp cuff and knows how to use it.  Explained after first prenatal appt pt will check weekly and document in Babyscripts.  Anatomy US Explained first scheduled [place] will be around 19 weeks. Anatomy US will be scheduled and she will be notified by MyChart.   Labs Discussed Avelina Laine genetic screening with patient. Would like both Panorama and Horizon drawn at new OB visit. Will do labs as needed.  Covid Vaccine Patient has not covid vaccine.   [facility] Referral Patient already has signed up for [facility].    First visit review I reviewed new OB appt with pt. I explained she will  have a  Exam and  ob bloodwork as needed. She would like genetic testing. Patient appointment changed to MD during visit due to her history.  Explained pt will be seen by Dr. Hyacinth Meeker at first visit; encounter routed to appropriate provider. Offered new  patient  monthly Zoom meeting  Brigitte Soderberg,RN 05/26/2020  9:21 AM

## 2020-06-01 ENCOUNTER — Encounter: Payer: 59 | Admitting: Certified Nurse Midwife

## 2020-06-05 ENCOUNTER — Telehealth: Payer: Self-pay | Admitting: *Deleted

## 2020-06-05 ENCOUNTER — Other Ambulatory Visit: Payer: Self-pay

## 2020-06-05 ENCOUNTER — Encounter (HOSPITAL_COMMUNITY): Payer: Self-pay | Admitting: Obstetrics and Gynecology

## 2020-06-05 ENCOUNTER — Inpatient Hospital Stay (HOSPITAL_COMMUNITY)
Admission: AD | Admit: 2020-06-05 | Discharge: 2020-06-05 | Disposition: A | Discharge status: Home / Self Care | Payer: 59 | Attending: Obstetrics and Gynecology | Admitting: Obstetrics and Gynecology

## 2020-06-05 DIAGNOSIS — R1084 Generalized abdominal pain: Secondary | ICD-10-CM | POA: Diagnosis not present

## 2020-06-05 DIAGNOSIS — O10911 Unspecified pre-existing hypertension complicating pregnancy, first trimester: Secondary | ICD-10-CM

## 2020-06-05 DIAGNOSIS — R1030 Lower abdominal pain, unspecified: Secondary | ICD-10-CM | POA: Diagnosis not present

## 2020-06-05 DIAGNOSIS — O0992 Supervision of high risk pregnancy, unspecified, second trimester: Secondary | ICD-10-CM | POA: Diagnosis not present

## 2020-06-05 DIAGNOSIS — N979 Female infertility, unspecified: Secondary | ICD-10-CM | POA: Diagnosis not present

## 2020-06-05 DIAGNOSIS — O26891 Other specified pregnancy related conditions, first trimester: Secondary | ICD-10-CM | POA: Diagnosis not present

## 2020-06-05 DIAGNOSIS — O26892 Other specified pregnancy related conditions, second trimester: Secondary | ICD-10-CM | POA: Diagnosis present

## 2020-06-05 DIAGNOSIS — Z3A14 14 weeks gestation of pregnancy: Secondary | ICD-10-CM | POA: Diagnosis not present

## 2020-06-05 DIAGNOSIS — O10912 Unspecified pre-existing hypertension complicating pregnancy, second trimester: Secondary | ICD-10-CM | POA: Diagnosis not present

## 2020-06-05 DIAGNOSIS — Z7901 Long term (current) use of anticoagulants: Secondary | ICD-10-CM | POA: Insufficient documentation

## 2020-06-05 DIAGNOSIS — R519 Headache, unspecified: Secondary | ICD-10-CM

## 2020-06-05 DIAGNOSIS — O0990 Supervision of high risk pregnancy, unspecified, unspecified trimester: Secondary | ICD-10-CM

## 2020-06-05 DIAGNOSIS — O10919 Unspecified pre-existing hypertension complicating pregnancy, unspecified trimester: Secondary | ICD-10-CM

## 2020-06-05 DIAGNOSIS — Z79899 Other long term (current) drug therapy: Secondary | ICD-10-CM | POA: Diagnosis not present

## 2020-06-05 LAB — URINALYSIS, ROUTINE W REFLEX MICROSCOPIC
Bilirubin Urine: NEGATIVE
Glucose, UA: NEGATIVE mg/dL
Hgb urine dipstick: NEGATIVE
Ketones, ur: NEGATIVE mg/dL
Nitrite: NEGATIVE
Protein, ur: NEGATIVE mg/dL
Specific Gravity, Urine: 1.013 (ref 1.005–1.030)
pH: 5 (ref 5.0–8.0)

## 2020-06-05 LAB — CBC
HCT: 35.3 % — ABNORMAL LOW (ref 36.0–46.0)
Hemoglobin: 12.6 g/dL (ref 12.0–15.0)
MCH: 32.1 pg (ref 26.0–34.0)
MCHC: 35.7 g/dL (ref 30.0–36.0)
MCV: 89.8 fL (ref 80.0–100.0)
Platelets: 304 10*3/uL (ref 150–400)
RBC: 3.93 MIL/uL (ref 3.87–5.11)
RDW: 13.4 % (ref 11.5–15.5)
WBC: 6.4 10*3/uL (ref 4.0–10.5)
nRBC: 0 % (ref 0.0–0.2)

## 2020-06-05 LAB — COMPREHENSIVE METABOLIC PANEL
ALT: 15 U/L (ref 0–44)
AST: 17 U/L (ref 15–41)
Albumin: 3.3 g/dL — ABNORMAL LOW (ref 3.5–5.0)
Alkaline Phosphatase: 36 U/L — ABNORMAL LOW (ref 38–126)
Anion gap: 8 (ref 5–15)
BUN: <5 mg/dL — ABNORMAL LOW (ref 6–20)
CO2: 22 mmol/L (ref 22–32)
Calcium: 9.8 mg/dL (ref 8.9–10.3)
Chloride: 102 mmol/L (ref 98–111)
Creatinine, Ser: 0.8 mg/dL (ref 0.44–1.00)
GFR, Estimated: >60 mL/min (ref >60)
Glucose, Bld: 93 mg/dL (ref 70–99)
Potassium: 3.8 mmol/L (ref 3.5–5.1)
Sodium: 132 mmol/L — ABNORMAL LOW (ref 135–145)
Total Bilirubin: 0.6 mg/dL (ref 0.3–1.2)
Total Protein: 7.1 g/dL (ref 6.5–8.1)

## 2020-06-05 LAB — PROTEIN / CREATININE RATIO, URINE
Creatinine, Urine: 188.68 mg/dL
Protein Creatinine Ratio: 0.04 mg/mg{Cre} (ref 0.00–0.15)
Total Protein, Urine: 7 mg/dL

## 2020-06-05 MED ORDER — HYOSCYAMINE SULFATE 0.125 MG PO TABS
0.1250 mg | ORAL_TABLET | Freq: Once | ORAL | Status: DC
  Filled 2020-06-05 (×2): qty 1

## 2020-06-05 MED ORDER — TRAMADOL HCL 50 MG PO TABS
50.0000 mg | ORAL_TABLET | Freq: Once | ORAL | Status: AC
  Administered 2020-06-05: 50 mg via ORAL
  Filled 2020-06-05: qty 1

## 2020-06-05 MED ORDER — LABETALOL HCL 200 MG PO TABS: 200.0000 mg | ORAL_TABLET | Freq: Two times a day (BID) | ORAL | 2 refills | Status: DC

## 2020-06-05 MED ORDER — LABETALOL HCL 100 MG PO TABS
200.0000 mg | ORAL_TABLET | Freq: Once | ORAL | Status: AC
  Administered 2020-06-05: 200 mg via ORAL
  Filled 2020-06-05: qty 2

## 2020-06-05 NOTE — MAU Provider Note (Addendum)
Chief Complaint: Abdominal Pain and Headache   Event Date/Time   First Provider Initiated Contact with Patient 06/05/20 [phone]        SUBJECTIVE HPI: Michelle Garrison is a 30 y.o. G1P0000 at [redacted]w[redacted]d by LMP who presents to maternity admissions reporting abdominal cramping all over and sharp pains in lower abdomen with movement and walking.  Cramping occurs even at rest.   Also has a headache, has been having them for a while   Stopped her antihypertensive med with pregnancy.  . She denies vaginal bleeding, vaginal itching/burning, urinary symptoms, dizziness, n/v, or fever/chills.    Abdominal Pain This is a new problem. The current episode started in the past 7 days. The problem occurs intermittently. The problem has been unchanged. The pain is located in the generalized abdominal region. The quality of the pain is cramping, colicky and sharp. The abdominal pain does not radiate. Associated symptoms include headaches. Pertinent negatives include no anorexia, constipation, diarrhea, dysuria, fever, frequency, myalgias, nausea or vomiting. Nothing aggravates the pain. The pain is relieved by nothing. She has tried nothing for the symptoms.  Headache  This is a recurrent problem. The current episode started 1 to 4 weeks ago. The problem has been unchanged. The quality of the pain is described as aching. Associated symptoms include abdominal pain. Pertinent negatives include no anorexia, blurred vision, coughing, dizziness, fever, muscle aches, nausea, photophobia, sinus pressure, visual change, vomiting or weakness. Nothing aggravates the symptoms. She has tried acetaminophen for the symptoms. The treatment provided no relief.   RN Note: SAYS LOWER ABD SHARP  PAIN STARTED Tuesday.  FEELS CRAMPING ALL OVER ABD . LAST BM WAS YESTERDAY .  [facility] WITH CLINIC.  NO VB.  HAS H/A NOW-- ALWAYS HAS  H/A'S -FAMILY  DR STARTED BP MEDS LAST YEAR-WITH RELIEF BUT  PT STOPPED MEDS HERSELF WHEN PREG. SO H/A'S AGAIN - NO MEDS  FOR H/A FROM CLINIC . TOOK TYLENOL XS 2 TABS - YESTERDAY - NO RELIEF.   Past Medical History:  Diagnosis Date  . Anal fissure   . Hypertension   . Infertility, female   . Rectal prolapse    Past Surgical History:  Procedure Laterality Date  . RECTAL SURGERY     hemorrhoids, anal fissure   Social History   Socioeconomic History  . Marital status: Married    Spouse name: Not on file  . Number of children: Not on file  . Years of education: Not on file  . Highest education level: Not on file  Occupational History  . Not on file  Tobacco Use  . Smoking status: Never Smoker  . Smokeless tobacco: Never Used  Vaping Use  . Vaping Use: Never used  Substance and Sexual Activity  . Alcohol use: No  . Drug use: No  . Sexual activity: Yes    Birth control/protection: None  Other Topics Concern  . Not on file  Social History Narrative  . Not on file   Social Determinants of Health   Financial Resource Strain: Not on file  Food Insecurity: No Food Insecurity  . Worried About Programme researcher, broadcasting/film/video in the Last Year: Never true  . Ran Out of Food in the Last Year: Never true  Transportation Needs: No Transportation Needs  . Lack of Transportation (Medical): No  . Lack of Transportation (Non-Medical): No  Physical Activity: Not on file  Stress: Not on file  Social Connections: Not on file  Intimate Partner Violence: Not on file  No current facility-administered medications on file prior to encounter.   Current Outpatient Medications on File Prior to Encounter  Medication Sig Dispense Refill  . folic acid (FOLVITE) 1 MG tablet Take 1 mg by mouth daily.    . Prenatal 27-1 MG TABS Take 1 tablet by mouth daily.    Marland Kitchen amLODipine (NORVASC) 10 MG tablet Take 1 tablet (10 mg total) by mouth daily. 90 tablet 1  . methocarbamol (ROBAXIN) 500 MG tablet Take 1 tablet (500 mg total) by mouth 2 (two) times daily. 20 tablet 0  . polyethylene glycol (MIRALAX) packet Take 17 g daily by mouth.  14 each 0   No Known Allergies  I have reviewed patient's Past Medical Hx, Surgical Hx, Family Hx, Social Hx, medications and allergies.   ROS:  Review of Systems  Constitutional: Negative for fever.  HENT: Negative for sinus pressure.   Eyes: Negative for blurred vision and photophobia.  Respiratory: Negative for cough.   Gastrointestinal: Positive for abdominal pain. Negative for anorexia, constipation, diarrhea, nausea and vomiting.  Genitourinary: Negative for dysuria and frequency.  Musculoskeletal: Negative for myalgias.  Neurological: Positive for headaches. Negative for dizziness and weakness.   Review of Systems  Other systems negative   Physical Exam  Physical Exam Patient Vitals for the past 24 hrs:  BP Temp Temp src Pulse Resp Height Weight  06/05/20 0643 (!) 131/95 98.6 F (37 C) Oral 88 20 5\' 4"  (1.626 m) 72.9 kg   Constitutional: Well-developed, well-nourished female in no acute distress.  Cardiovascular: normal rate Respiratory: normal effort GI: Abd soft, non-tender. Pos BS x 4 MS: Extremities nontender, no edema, normal ROM Neurologic: Alert and oriented x 4.  GU: Neg CVAT.  PELVIC EXAM: deferred FHT 156 by doppler  LAB RESULTS CBC, CMET ordered  IMAGING No results found.  MAU Management/MDM: Ordered labs to rule out acute processes Ordered Levsin to see if it will quiet the generalized abdominal cramping Ordered Labetalol to help get BP down Ordered Tramadol for headache   ASSESSMENT Single IUP at [redacted]w[redacted]d Generalized abdominal cramping Lower abdominal sharp pain Headache Chronic Hypertension, self-discontinued meds  PLAN Care turned over to oncoming CNM  [redacted]w[redacted]d CNM, MSN Certified Nurse-Midwife 06/05/2020  7:13 AM   MDM: Pt with complete relief of abdominal pain and headache.  BP wnl with labetalol dose given.  Will D/C home with labetalol 200 mg BID as Rx.  Pt to treat h/a at home with Tylenol and small amounts of caffeine  PRN and f/u with Dr 08/03/2020 at [facility] on 06/08/20 as scheduled.  Pt stable at time of D/C.  1. Chronic hypertension complicating or reason for care during pregnancy, first trimester   2. Supervision of high risk pregnancy, antepartum   3. Preexisting hypertension complicating pregnancy, antepartum   4. Infertility, female   5. Pregnancy headache in first trimester     D/C home with return precautions  08/06/20, CNM 9:34 AM

## 2020-06-05 NOTE — Telephone Encounter (Signed)
Received call from Babyscripts in order to report pt had elevated BP reading last night of 140/91. Per chart review, pt was seen @ MAU today for c/o abdominal cramping and had elevated BP during the visit. She was given Rx for Labetalol.

## 2020-06-05 NOTE — MAU Note (Signed)
SAYS LOWER ABD SHARP  PAIN STARTED Tuesday.  FEELS CRAMPING ALL OVER ABD . LAST BM WAS YESTERDAY .  [facility] WITH CLINIC.  NO VB.  HAS H/A NOW-- ALWAYS HAS  H/A'S -FAMILY  DR STARTED BP MEDS LAST YEAR-WITH RELIEF BUT  PT STOPPED MEDS HERSELF WHEN PREG. SO H/A'S AGAIN - NO MEDS FOR H/A FROM CLINIC . TOOK TYLENOL XS 2 TABS - YESTERDAY - NO RELIEF.

## 2020-06-08 ENCOUNTER — Encounter: Payer: Self-pay | Admitting: Obstetrics & Gynecology

## 2020-06-08 ENCOUNTER — Other Ambulatory Visit (HOSPITAL_COMMUNITY)
Admission: RE | Admit: 2020-06-08 | Discharge: 2020-06-08 | Disposition: A | Discharge status: Home / Self Care | Payer: 59 | Source: Ambulatory Visit | Attending: Obstetrics & Gynecology | Admitting: Obstetrics & Gynecology

## 2020-06-08 ENCOUNTER — Ambulatory Visit (INDEPENDENT_AMBULATORY_CARE_PROVIDER_SITE_OTHER): Payer: 59 | Admitting: Obstetrics & Gynecology

## 2020-06-08 ENCOUNTER — Other Ambulatory Visit: Payer: Self-pay

## 2020-06-08 VITALS — BP 125/79 | HR 72 | Wt 158.4 lb

## 2020-06-08 DIAGNOSIS — O10919 Unspecified pre-existing hypertension complicating pregnancy, unspecified trimester: Secondary | ICD-10-CM

## 2020-06-08 DIAGNOSIS — N871 Moderate cervical dysplasia: Secondary | ICD-10-CM

## 2020-06-08 DIAGNOSIS — O0990 Supervision of high risk pregnancy, unspecified, unspecified trimester: Secondary | ICD-10-CM | POA: Insufficient documentation

## 2020-06-08 DIAGNOSIS — Z8619 Personal history of other infectious and parasitic diseases: Secondary | ICD-10-CM

## 2020-06-08 DIAGNOSIS — O219 Vomiting of pregnancy, unspecified: Secondary | ICD-10-CM

## 2020-06-08 DIAGNOSIS — Z3A14 14 weeks gestation of pregnancy: Secondary | ICD-10-CM

## 2020-06-08 LAB — OB RESULTS CONSOLE GBS: GBS: POSITIVE

## 2020-06-08 MED ORDER — PROMETHAZINE HCL 25 MG PO TABS: ORAL_TABLET | ORAL | 1 refills | Status: DC

## 2020-06-08 MED ORDER — ASPIRIN EC 81 MG PO TBEC: 81.0000 mg | DELAYED_RELEASE_TABLET | Freq: Every day | ORAL | 11 refills | Status: DC

## 2020-06-08 NOTE — Progress Notes (Signed)
History:   Michelle Garrison is a 30 y.o. G1P0000 at [redacted]w[redacted]d by LMP being seen today for her first obstetrical visit.  Her obstetrical history is significant for chronic hypertension.  She did have trichomonas in April, 2021.  This was treated and repeat testing was negative.  Has hx of remote GC/Chl ( Patient does intend to breast feed. Pregnancy history fully reviewed.  Patient reports nausea and vomiting.  She reports she this has improved some but still is present.  Has not taken anything for this but would like to try medication.  She has hx of infertility.  Used femara and had inseminations to conceive.      HISTORY: OB History  Gravida Para Term Preterm AB Living  1 0 0 0 0 0  SAB IAB Ectopic Multiple Live Births  0 0 0 0 0    # Outcome Date GA Lbr Len/2nd Weight Sex Delivery Anes PTL Lv  1 Current             Last pap smear was done 10/2019 and was abnormal - LGSIL.  Colposcopy done in September with CIN 2 present.  Past Medical History:  Diagnosis Date  . Anal fissure   . History of chlamydia    in high school  . History of gonorrhea    in high school  . Hypertension   . Infertility, female   . Rectal prolapse    Past Surgical History:  Procedure Laterality Date  . RECTAL SURGERY  2013   hemorrhoids, anal fissure   Family History  Problem Relation Age of Onset  . Diabetes Mother   . Healthy Father    Social History   Tobacco Use  . Smoking status: Never Smoker  . Smokeless tobacco: Never Used  Vaping Use  . Vaping Use: Never used  Substance Use Topics  . Alcohol use: No  . Drug use: No   No Known Allergies Current Outpatient Medications on File Prior to Visit  Medication Sig Dispense Refill  . folic acid (FOLVITE) 1 MG tablet Take 1 mg by mouth daily.    Marland Kitchen labetalol (NORMODYNE) 200 MG tablet Take 1 tablet (200 mg total) by mouth 2 (two) times daily. 60 tablet 2  . Prenatal 27-1 MG TABS Take 1 tablet by mouth daily.    . polyethylene glycol (MIRALAX)  packet Take 17 g daily by mouth. (Patient not taking: Reported on 06/08/2020) 14 each 0   No current facility-administered medications on file prior to visit.    Review of Systems Pertinent items noted in HPI and remainder of comprehensive ROS otherwise negative.  Physical Exam:   Vitals:   06/08/20 1333  BP: 125/79  Pulse: 72  Weight: 158 lb 5.8 oz (71.8 kg)     Bedside Ultrasound for FHR check: Viable intrauterine pregnancy with positive cardiac activity noted, fetal heart rate 155 bpm Patient informed that the ultrasound is considered a limited obstetric ultrasound and is not intended to be a complete ultrasound exam.  Patient also informed that the ultrasound is not being completed with the intent of assessing for fetal or placental anomalies or any pelvic abnormalities.  Explained that the purpose of today's ultrasound is to assess for fetal heart rate.  Patient acknowledges the purpose of the exam and the limitations of the study. General: well-developed, well-nourished female in no acute distress  Breasts:  normal appearance, no masses or tenderness bilaterally  Skin: normal coloration and turgor, no rashes  Neurologic: oriented, normal, negative,  normal mood  Extremities: normal strength, tone, and muscle mass, ROM of all joints is normal  HEENT PERRLA, extraocular movement intact and sclera clear, anicteric  Neck supple and no masses  Cardiovascular: regular rate and rhythm  Respiratory:  no respiratory distress, normal breath sounds  Abdomen: soft, non-tender; bowel sounds normal; no masses,  no organomegaly  Pelvic: deferred    Assessment:    Pregnancy: G1P0000 Patient Active Problem List   Diagnosis Date Noted  . History of trichomoniasis 06/08/2020  . 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 Supervision of high risk pregnancy, antepartum 05/26/2020  . Chronic hypertension during pregnancy 05/26/2020  . Infertility, female 05/26/2020     Plan:    1.  Supervision of high risk pregnancy, antepartum - Culture, OB Urine - Genetic Screening - CBC/D/Plt+RPR+Rh+ABO+Rub Ab... - Hemoglobin A1c - Cervicovaginal ancillary only( [place])  2. [redacted] weeks gestation of pregnancy -on PNV  3. Chronic hypertension during pregnancy - CHL AMB BABYSCRIPTS SCHEDULE OPTIMIZATION - aspirin EC 81 MG tablet; Take 1 tablet (81 mg total) by mouth daily. Swallow whole.  Dispense: 30 tablet; Refill: 11 - on labetalol 100mg  bid.  Does not need rx. - growth scans and BPP later in pregnancy discussed  4. History of trichomoniasis  5. Nausea and vomiting during pregnancy - promethazine (PHENERGAN) 25 MG tablet; 1/2 to 1 tab every 6 hours as needed for nausea  Dispense: 30 tablet; Refill: 1  6.  CIN 2.  H/o LGSIL pap 10/2019. - will need PP pap smear  Initial labs drawn. Continue prenatal vitamins. Problem list reviewed and updated. Genetic Screening discussed, NIPS: requested. Ultrasound discussed; fetal anatomic survey: requested. Anticipatory guidance about prenatal visits given including labs, ultrasounds, and testing. Discussed usage of Babyscripts and virtual visits as additional source of managing and completing prenatal visits in midst of coronavirus and pandemic.   Encouraged to complete MyChart Registration for her ability to review results, send requests, and have questions addressed.  The nature of [facility] with multiple MDs and Advanced Practice Providers was explained to patient; also emphasized that residents, students are part of our team. Routine obstetric precautions reviewed. Encouraged to seek out care at office or emergency room [facility] MAU preferred) for urgent and/or emergent concerns.  Return in about 4 weeks (around 07/06/2020).     09/05/2020, MD, FACOG Obstetrician & Gynecologist, [facility], [facility]

## 2020-06-09 ENCOUNTER — Encounter (HOSPITAL_BASED_OUTPATIENT_CLINIC_OR_DEPARTMENT_OTHER): Payer: Self-pay

## 2020-06-09 LAB — CBC/D/PLT+RPR+RH+ABO+RUB AB...
Antibody Screen: NEGATIVE
Basophils Absolute: 0 10*3/uL (ref 0.0–0.2)
Basos: 0 %
EOS (ABSOLUTE): 0 10*3/uL (ref 0.0–0.4)
Eos: 0 %
HCV Ab: <0.1 s/co ratio (ref 0.0–0.9)
HIV Screen 4th Generation wRfx: NONREACTIVE
Hematocrit: 36.1 % (ref 34.0–46.6)
Hemoglobin: 12.3 g/dL (ref 11.1–15.9)
Hepatitis B Surface Ag: NEGATIVE
Immature Grans (Abs): 0 10*3/uL (ref 0.0–0.1)
Immature Granulocytes: 1 %
Lymphocytes Absolute: 1.4 10*3/uL (ref 0.7–3.1)
Lymphs: 27 %
MCH: 31 pg (ref 26.6–33.0)
MCHC: 34.1 g/dL (ref 31.5–35.7)
MCV: 91 fL (ref 79–97)
Monocytes Absolute: 0.7 10*3/uL (ref 0.1–0.9)
Monocytes: 12 %
Neutrophils Absolute: 3.2 10*3/uL (ref 1.4–7.0)
Neutrophils: 60 %
Platelets: 311 10*3/uL (ref 150–450)
RBC: 3.97 x10E6/uL (ref 3.77–5.28)
RDW: 13 % (ref 11.7–15.4)
RPR Ser Ql: NONREACTIVE
Rh Factor: POSITIVE
Rubella Antibodies, IGG: 2.37 index (ref >0.99)
WBC: 5.3 10*3/uL (ref 3.4–10.8)

## 2020-06-09 LAB — CERVICOVAGINAL ANCILLARY ONLY
Bacterial Vaginitis (gardnerella): NEGATIVE
Candida Glabrata: NEGATIVE
Candida Vaginitis: POSITIVE — AB
Chlamydia: NEGATIVE
Comment: NEGATIVE
Comment: NEGATIVE
Comment: NEGATIVE
Comment: NEGATIVE
Comment: NEGATIVE
Comment: NORMAL
Neisseria Gonorrhea: NEGATIVE
Trichomonas: NEGATIVE

## 2020-06-09 LAB — HEMOGLOBIN A1C
Est. average glucose Bld gHb Est-mCnc: 94 mg/dL
Hgb A1c MFr Bld: 4.9 % (ref 4.8–5.6)

## 2020-06-09 LAB — HCV INTERPRETATION

## 2020-06-10 ENCOUNTER — Other Ambulatory Visit: Payer: Self-pay | Admitting: Lactation Services

## 2020-06-10 ENCOUNTER — Encounter: Payer: Self-pay | Admitting: *Deleted

## 2020-06-10 MED ORDER — TERCONAZOLE 0.4 % VA CREA: 1.0000 | TOPICAL_CREAM | Freq: Every day | VAGINAL | 0 refills | Status: DC

## 2020-06-11 ENCOUNTER — Encounter (HOSPITAL_BASED_OUTPATIENT_CLINIC_OR_DEPARTMENT_OTHER): Payer: Self-pay

## 2020-06-12 ENCOUNTER — Inpatient Hospital Stay (HOSPITAL_COMMUNITY)
Admission: AD | Admit: 2020-06-12 | Discharge: 2020-06-12 | Disposition: A | Discharge status: Home / Self Care | Payer: 59 | Attending: Obstetrics and Gynecology | Admitting: Obstetrics and Gynecology

## 2020-06-12 ENCOUNTER — Other Ambulatory Visit: Payer: Self-pay

## 2020-06-12 ENCOUNTER — Telehealth: Payer: Self-pay | Admitting: *Deleted

## 2020-06-12 ENCOUNTER — Inpatient Hospital Stay (HOSPITAL_COMMUNITY)
Admission: AD | Admit: 2020-06-12 | Discharge: 2020-06-12 | Disposition: A | Discharge status: Home / Self Care | Payer: 59 | Source: Home / Self Care | Attending: Obstetrics and Gynecology | Admitting: Obstetrics and Gynecology

## 2020-06-12 ENCOUNTER — Encounter (HOSPITAL_COMMUNITY): Payer: Self-pay | Admitting: Obstetrics and Gynecology

## 2020-06-12 ENCOUNTER — Encounter (HOSPITAL_BASED_OUTPATIENT_CLINIC_OR_DEPARTMENT_OTHER): Payer: Self-pay

## 2020-06-12 DIAGNOSIS — Z3A00 Weeks of gestation of pregnancy not specified: Secondary | ICD-10-CM

## 2020-06-12 DIAGNOSIS — N979 Female infertility, unspecified: Secondary | ICD-10-CM

## 2020-06-12 DIAGNOSIS — K6289 Other specified diseases of anus and rectum: Secondary | ICD-10-CM

## 2020-06-12 DIAGNOSIS — O2242 Hemorrhoids in pregnancy, second trimester: Secondary | ICD-10-CM | POA: Diagnosis not present

## 2020-06-12 DIAGNOSIS — Z3A15 15 weeks gestation of pregnancy: Secondary | ICD-10-CM

## 2020-06-12 DIAGNOSIS — Z7982 Long term (current) use of aspirin: Secondary | ICD-10-CM | POA: Insufficient documentation

## 2020-06-12 DIAGNOSIS — Z79899 Other long term (current) drug therapy: Secondary | ICD-10-CM | POA: Insufficient documentation

## 2020-06-12 DIAGNOSIS — Z8719 Personal history of other diseases of the digestive system: Secondary | ICD-10-CM

## 2020-06-12 DIAGNOSIS — O10919 Unspecified pre-existing hypertension complicating pregnancy, unspecified trimester: Secondary | ICD-10-CM

## 2020-06-12 DIAGNOSIS — O99619 Diseases of the digestive system complicating pregnancy, unspecified trimester: Secondary | ICD-10-CM

## 2020-06-12 DIAGNOSIS — K644 Residual hemorrhoidal skin tags: Secondary | ICD-10-CM | POA: Insufficient documentation

## 2020-06-12 DIAGNOSIS — O2240 Hemorrhoids in pregnancy, unspecified trimester: Secondary | ICD-10-CM

## 2020-06-12 DIAGNOSIS — O0990 Supervision of high risk pregnancy, unspecified, unspecified trimester: Secondary | ICD-10-CM

## 2020-06-12 LAB — URINE CULTURE, OB REFLEX

## 2020-06-12 LAB — CULTURE, OB URINE

## 2020-06-12 MED ORDER — OXYCODONE-ACETAMINOPHEN 5-325 MG PO TABS
2.0000 | ORAL_TABLET | Freq: Once | ORAL | Status: AC
  Administered 2020-06-12: 2 via ORAL
  Filled 2020-06-12: qty 2

## 2020-06-12 MED ORDER — HYDROCORTISONE ACETATE 25 MG RE SUPP: 25.0000 mg | Freq: Three times a day (TID) | RECTAL | 1 refills | Status: DC

## 2020-06-12 MED ORDER — DOCUSATE SODIUM 100 MG PO CAPS: 100.0000 mg | ORAL_CAPSULE | Freq: Two times a day (BID) | ORAL | 2 refills | Status: DC

## 2020-06-12 MED ORDER — WITCH HAZEL-GLYCERIN EX PADS
MEDICATED_PAD | CUTANEOUS | Status: DC | PRN
  Administered 2020-06-12: 1 via TOPICAL

## 2020-06-12 MED ORDER — HYDROCORT-PRAMOXINE (PERIANAL) 1-1 % EX FOAM: 1.0000 | Freq: Three times a day (TID) | CUTANEOUS | 1 refills | Status: DC | PRN

## 2020-06-12 MED ORDER — POLYETHYLENE GLYCOL 3350 17 G PO PACK: 17.0000 g | PACK | Freq: Two times a day (BID) | ORAL | 0 refills | Status: DC

## 2020-06-12 MED ORDER — HYDROCORTISONE (PERIANAL) 2.5 % EX CREA: 1.0000 "application " | TOPICAL_CREAM | Freq: Three times a day (TID) | CUTANEOUS | 0 refills | Status: DC

## 2020-06-12 MED ORDER — TRAMADOL-ACETAMINOPHEN 37.5-325 MG PO TABS
2.0000 | ORAL_TABLET | Freq: Once | ORAL | Status: AC
  Administered 2020-06-12: 2 via ORAL
  Filled 2020-06-12 (×2): qty 2

## 2020-06-12 NOTE — MAU Note (Signed)
Patient reports to MAU stating she has pain in her rectum that began yesterday morning but has gotten worse.  Pain is constant and feels like a throbbing pain.

## 2020-06-12 NOTE — MAU Provider Note (Signed)
Event Date/Time   First Provider Initiated Contact with Patient 06/12/20 2132      S [name] is a 30 y.o. G1P0000 patient who presents to MAU today with complaint of rectal pain.  Patient was seen earlier today for same complaint and reports no relief with medication dosing or proctofoam usage. Patient reports history of anal fissures, hemorrhoids, and rectal prolapse and states this pain is worse.    O BP 140/87 (BP Location: Right Arm)   Pulse (!) 106   Temp 98.5 F (36.9 C) (Oral)   Resp 20   Ht 5\' 4"  (1.626 m)   Wt 71.7 kg   LMP 02/26/2020   BMI 27.14 kg/m  Physical Exam Vitals and nursing note reviewed.  Eyes:     Conjunctiva/sclera: Conjunctivae normal.  Cardiovascular:     Rate and Rhythm: Normal rate.  Pulmonary:     Effort: Pulmonary effort is normal. No respiratory distress.  Musculoskeletal:        General: Normal range of motion.     Cervical back: Normal range of motion.  Neurological:     Mental Status: She is alert and oriented to person, place, and time.  Psychiatric:        Mood and Affect: Mood normal.        Behavior: Behavior normal.        Thought Content: Thought content normal.     A Medical screening exam complete External Hemorrhoids Rectal Pain H/O Anal Fissures  P -Consulted with Dr. 02/28/2020 who recommends calling [facility] for guidance. -I was able to connect with Dr. Karolee Ohs who was kind enough to consult and offer recommendations for treatment.  He advised: *GI consult for further evaluation *Miralax usage daily *Anusol Suppository TID  *Topical Hydrocortisone Cream 2.5% -*ducated provider on possibility of anal fissure presence as well as rectal abscess. -Provider discusses recommendations with patient including concern for rectal abscess.  Patient denies fever or chills.  She reports no difficulty with urination less an inability to sit down due to increased pressure on rectum. -Educated on how growing uterus can add  additional weight and pressure on vaginal floor and rectum making perception of pain/discomfort worse. -Patient offered Pelvic CT for evaluation to r/o possible rectal abscess.  -Provider reviewed small but unproved risks to fetus with radiation exposure but could possibly include cancer later in life.  -Patient states she would like to wait and not receive CT at this junction. -Provider offers pain medication and informed that narcotics have been shown to cause constipation.  However one dose should have limited effects. -Patient agreeable and given 2 tablets of percocet. -Instructed to manage symptoms over the weekend and if no improvement to report to [facility] for further evaluation. -GI referral placed. -Prescriptions as above sent to 24 hour pharmacy. -Patient verbalized understanding and without further questions.  -Discharge from MAU in stable condition -Warning signs for worsening condition that would warrant emergency follow-up discussed -Patient may return to MAU as needed for pregnancy related concerns.   [facility], CNM 06/12/2020 9:32 PM

## 2020-06-12 NOTE — Telephone Encounter (Signed)
Pt called related to Rx: proctofoam-HC not being covered by insuranc .Marland KitchenMarland KitchenEDCM clarified with MAU APP that pt OG/GYN office is aware.

## 2020-06-12 NOTE — MAU Provider Note (Signed)
History     CSN: [id number]  Arrival date and time: 06/12/20 6433   Event Date/Time   First Provider Initiated Contact with Patient 06/12/20 0719      Chief Complaint  Patient presents with  . Rectal Pain   Michelle Garrison is a 30 y.o. G1P0 at [redacted]w[redacted]d who receives care at [facility].  She presents today for Rectal Pain.  Patient states she has been experiencing a constant "throbbing pain" since yesterday.  She states she used preparation h and lidocaine with no relief symptoms.  Patient reports constipation and had a bowel movement yesterday after performing an enema.  She reports her pain started "right after."  Patient reports she had not had a bowel movement for at least 3-4 days prior to the enema.  Patient denies blood in stool or with wiping. Patient states the pain is worse with sitting, laying on her back, or using the restroom.  She reports she has not noted any relieving factors for the pain.  She rates the pain a 10/10 and reports taking tylenol with pain onset and did not note improvements.  Patient endorses a history of hemorrhoids, but "nothing that ever hurt like this before."  Patient denies vaginal bleeding, discharge, or pain/difficulty with urination.    OB History    Gravida  1   Para  0   Term  0   Preterm  0   AB  0   Living  0     SAB  0   IAB  0   Ectopic  0   Multiple  0   Live Births  0           Past Medical History:  Diagnosis Date  . Anal fissure   . History of chlamydia    in high school  . History of gonorrhea    in high school  . Hypertension   . Infertility, female   . Rectal prolapse     Past Surgical History:  Procedure Laterality Date  . RECTAL SURGERY  2013   hemorrhoids, anal fissure    Family History  Problem Relation Age of Onset  . Diabetes Mother   . Healthy Father     Social History   Tobacco Use  . Smoking status: Never Smoker  . Smokeless tobacco: Never Used  Vaping Use  . Vaping Use: Never used   Substance Use Topics  . Alcohol use: No  . Drug use: No    Allergies: No Known Allergies  Medications Prior to Admission  Medication Sig Dispense Refill Last Dose  . aspirin EC 81 MG tablet Take 1 tablet (81 mg total) by mouth daily. Swallow whole. 30 tablet 11 06/11/2020 at Unknown time  . folic acid (FOLVITE) 1 MG tablet Take 1 mg by mouth daily.   06/11/2020 at Unknown time  . labetalol (NORMODYNE) 200 MG tablet Take 1 tablet (200 mg total) by mouth 2 (two) times daily. 60 tablet 2 06/11/2020 at Unknown time  . Prenatal 27-1 MG TABS Take 1 tablet by mouth daily.   06/11/2020 at Unknown time  . terconazole (TERAZOL 7) 0.4 % vaginal cream Place 1 applicator vaginally at bedtime. 45 g 0 06/11/2020 at Unknown time  . polyethylene glycol (MIRALAX) packet Take 17 g daily by mouth. (Patient not taking: Reported on 06/08/2020) 14 each 0   . promethazine (PHENERGAN) 25 MG tablet 1/2 to 1 tab every 6 hours as needed for nausea 30 tablet 1 More than a month  at Unknown time    Review of Systems  Constitutional: Negative for chills and fever.  Respiratory: Negative for cough and shortness of breath.   Gastrointestinal: Positive for constipation and rectal pain. Negative for abdominal pain, diarrhea, nausea and vomiting.  Genitourinary: Negative for difficulty urinating, dysuria, pelvic pain, vaginal bleeding and vaginal discharge.  Musculoskeletal: Negative for back pain.  Neurological: Negative for dizziness, light-headedness and headaches.   Physical Exam   Blood pressure 119/71, pulse 78, temperature 98.4 F (36.9 C), temperature source Oral, resp. rate 18, height 5\' 4"  (1.626 m), weight 72.1 kg, last menstrual period 02/26/2020, SpO2 100 %.  Physical Exam Exam conducted with a chaperone present.  Constitutional:      Appearance: Normal appearance.  HENT:     Head: Normocephalic and atraumatic.  Eyes:     Conjunctiva/sclera: Conjunctivae normal.  Cardiovascular:     Rate and Rhythm:  Normal rate and regular rhythm.     Heart sounds: Normal heart sounds.  Pulmonary:     Effort: Pulmonary effort is normal. No respiratory distress.     Breath sounds: Normal breath sounds.  Genitourinary:    Rectum: External hemorrhoid present.     Comments: External Hemorrhoids noted.  Flesh colored and without bleeding or thrombosis. Attempted internal exam for evaluation for internal hemorrhoids and/or anal fissure, but patient discomfort extensive so exam ended.  Musculoskeletal:        General: Normal range of motion.  Skin:    General: Skin is warm and dry.  Neurological:     Mental Status: She is alert and oriented to person, place, and time.  Psychiatric:        Mood and Affect: Mood normal.        Behavior: Behavior normal.        Thought Content: Thought content normal.     MAU Course  Procedures No results found for this or any previous visit (from the past 24 hour(s)).  MDM Physical Exam Pain Medication Topical Astringent  Assessment and Plan  30 year old G1P0 at 15.2 weeks Rectal Pain  -POC Reviewed. -Exam performed and findings discussed. -Unable to perform internal exam due to patient pain and discomfort. -Reassured that hemorrhoids did not appear thrombosed, which would require surgical intervention. -Cautioned about performing home enemas with history of fissues and rectal surgery.  -Will give pain medication and tucks pads while present. -Discussed usage of colace BID for 2 weeks for softening of stool then daily at bedtime.  -Discussed usage of protoform TID prn for pain and itching. -Rx for colace and protofom sent to pharmacy on file. -Patient informed that provider unaware of when hemorrhoids will resolve. -Discussed increased water and fiber in diet to decrease incidents of constipation. -Encouraged rest and relaxation and continued monitoring for worsening of symptoms. -Encouraged to call or return to MAU if symptoms worsen or with the onset of new  symptoms. -Discharged to home in stable condition.  26 06/12/2020, 7:19 AM

## 2020-06-12 NOTE — MAU Note (Signed)
PT SAYS ON Thursday MORN- SHE WAS TRYING TO HAVE BM- TRIED FOR 2 HRS- WENT TO STORE AND GOT FLEETS ENEMA - GOOD RESULTS. HAS HEMORR- BEFORE AND NOW WORSE- SAYS THE HEMORR ON INSIDE .  SHE CAME TO MAU THIS AM- GAVE PAIN PILLS HERE- NO RELIEF.   RX- FOAM-USED X3 TODAY - NO RELIEF.

## 2020-06-12 NOTE — Discharge Instructions (Signed)

## 2020-06-16 ENCOUNTER — Telehealth: Payer: Self-pay

## 2020-06-16 NOTE — Telephone Encounter (Signed)
Opened in error

## 2020-06-16 NOTE — Telephone Encounter (Signed)
Thank you for seeing this.  These three messages just came to me today so I thought they were all from today.  It appears they were written on 2/11.  I think it would be good to follow up with her and make sure she is better.  Thanks.

## 2020-06-16 NOTE — Telephone Encounter (Signed)
Called pt to follow-up on recent visit to MAU for severe rectal pain. Pt reports improvement since MAU. Pt reports continuing Miralax, combination docusate sodium and sennoside pill, and suppositories. Pt states pain is much better, still having to strain at times with a bowel movement, but this is improving.  Pt inquired about genetic screening results. Results not available in [facility] portal at this time. Pt has signed up for her own portal account and will keep checking for results. I explained these usually take about 2 weeks to result.   Pt will follow up with our office with any worsening of symptoms.

## 2020-06-17 ENCOUNTER — Telehealth: Payer: Self-pay | Admitting: Lactation Services

## 2020-06-17 ENCOUNTER — Telehealth: Payer: Self-pay | Admitting: Family Medicine

## 2020-06-17 DIAGNOSIS — O2240 Hemorrhoids in pregnancy, unspecified trimester: Secondary | ICD-10-CM

## 2020-06-17 MED ORDER — AMOXICILLIN 500 MG PO CAPS: 500.0000 mg | ORAL_CAPSULE | Freq: Three times a day (TID) | ORAL | 0 refills | Status: DC

## 2020-06-17 NOTE — Telephone Encounter (Signed)
Called and spoke with patient. She is having blood in her stools, she is noticing bleeding at the time of stooling.    She is at the pharmacy picking up her antibiotics that were ordered earlier for UTI.   She was having difficulty with stooling last week and is taking Miralax and Colace BID as prescribed. She reports stooling is much easier this week as compared to last week.   She has stooled 3-4 times in the last 24 hours and is noting blood with stooling. She is noting dripping in the toilet in the last 2 days. She had no bleeding prior to that last 2 days.   She is using Proctofoam TID-QID but feels it is not working. Anusol was not covered by her insurance, instead she got Preparation H Suppositories and has been using every 3 hours and feels like it is not working. She has Tucks pads she is using also.   She has a history of fissures, rectal prolapse and thrombotic hemmorrhoids. She has surgery about 5-6 years ago at [facility], she did well since then until she got pregnant. She was not able to follow up with specialist due to not having insurance.   Spoke with Dr. Crissie Reese who recommends she decrease the Preparation H to BID and send surgical consult request to evaluate for internal hemorrhoids.   Reviewed with patient that she may be experiencing fissures or internal hemorrhoids based on symptoms.   Patient informed and voiced understanding.

## 2020-06-17 NOTE — Telephone Encounter (Signed)
Called Michelle Garrison at [facility] to let them know of need for referral for consult for hemorrhoids.   Appointment scheduled for Monday 2/21 at 2:30, arrive at 2 pm.   Patient called and informed of appointment date, time and location. She was driving and could not write down, sent My Chart message.

## 2020-06-17 NOTE — Telephone Encounter (Signed)
Patient is having blood in her stools, since yesterday want a call back ASAP

## 2020-06-17 NOTE — Addendum Note (Signed)
Addended by: Ed Blalock on: 06/17/2020 08:53 AM   Modules accepted: Orders

## 2020-06-17 NOTE — Telephone Encounter (Signed)
Called patient to inform her of Urine Culture results. Did not reach her. LM on there phone that a prescription was sent to her [facility] on [address] and that we would like her to pick up and start taking. LM I will be sending a My Chart message with more information and to call the office if she has further questions or concerns.   My Chart message sent.

## 2020-06-17 NOTE — Telephone Encounter (Signed)
-----   Message from Jerene Bears, MD sent at 06/17/2020  3:10 AM EST ----- Please let pt know her urine culture showed GBS.  She was not symptomatic.  Needs treatment with Amoxicillin 500mg  tid x 7 days.  She has no allergies.

## 2020-06-19 ENCOUNTER — Telehealth: Payer: Self-pay

## 2020-06-19 ENCOUNTER — Encounter (HOSPITAL_BASED_OUTPATIENT_CLINIC_OR_DEPARTMENT_OTHER): Payer: Self-pay

## 2020-06-19 DIAGNOSIS — O2240 Hemorrhoids in pregnancy, unspecified trimester: Secondary | ICD-10-CM

## 2020-06-19 DIAGNOSIS — O0990 Supervision of high risk pregnancy, unspecified, unspecified trimester: Secondary | ICD-10-CM

## 2020-06-19 MED ORDER — HYDROCORTISONE ACETATE 25 MG RE SUPP: 25.0000 mg | Freq: Three times a day (TID) | RECTAL | 1 refills | Status: DC

## 2020-06-19 MED ORDER — TERCONAZOLE 0.4 % VA CREA: 1.0000 | TOPICAL_CREAM | Freq: Every day | VAGINAL | 0 refills | Status: DC

## 2020-06-19 NOTE — Telephone Encounter (Signed)
Pt called front office to report thick, white vaginal discharge. Returned pt's call. Pt states she completed 1 day of Terazol vaginal cream after yeast infection was confirmed 06/08/20. Explained to pt this should be applied nightly for at least 3 days; medication reordered. Pt has been unable to pick up Anusol cream or suppository due to cost. Hemorrhoids continue to improve but pt would like to try these medications. Called pharmacy, Anusol cream will be covered by insurance, suppositories will not be covered. Suppository sent to [facility] pharmacy and Good Rx coupon sent via text to pt. Pt has no other concerns at this time.

## 2020-06-20 ENCOUNTER — Other Ambulatory Visit: Payer: Self-pay | Admitting: Obstetrics & Gynecology

## 2020-06-22 ENCOUNTER — Encounter: Payer: Self-pay | Admitting: Surgery

## 2020-06-22 ENCOUNTER — Ambulatory Visit: Payer: Self-pay | Admitting: Surgery

## 2020-06-22 DIAGNOSIS — K602 Anal fissure, unspecified: Secondary | ICD-10-CM

## 2020-06-22 DIAGNOSIS — Z3491 Encounter for supervision of normal pregnancy, unspecified, first trimester: Secondary | ICD-10-CM | POA: Insufficient documentation

## 2020-06-22 DIAGNOSIS — K645 Perianal venous thrombosis: Secondary | ICD-10-CM

## 2020-06-22 DIAGNOSIS — K5909 Other constipation: Secondary | ICD-10-CM | POA: Insufficient documentation

## 2020-06-30 ENCOUNTER — Encounter: Payer: Self-pay | Admitting: *Deleted

## 2020-06-30 NOTE — Telephone Encounter (Signed)
Forwarding this message so pt can be called.

## 2020-07-01 ENCOUNTER — Telehealth: Payer: Self-pay | Admitting: Lactation Services

## 2020-07-01 ENCOUNTER — Encounter: Payer: Self-pay | Admitting: *Deleted

## 2020-07-01 ENCOUNTER — Telehealth: Payer: Self-pay

## 2020-07-01 DIAGNOSIS — O285 Abnormal chromosomal and genetic finding on antenatal screening of mother: Secondary | ICD-10-CM

## 2020-07-01 NOTE — Telephone Encounter (Signed)
Careers information officer to register pt and schedule ride for genetic counseling appt tomorrow.

## 2020-07-01 NOTE — Telephone Encounter (Signed)
Called patient back in regards to her genetic screening. She reports she was called yesterday and is aware of the results.   Patient reports she spoke with Dr. Hyacinth Meeker yesterday who is trying to get patient in sooner for an [place], she was planning to call patient.   Patient in interested in Genetic Counseling with M[facility].  Order placed. Called [facility] to set up Genetic Counseling Session-3/2 at 10:30 in [facility], patient to arrive at 10:15. Patient informed of appt and encouraged to bring partner if desired.

## 2020-07-02 ENCOUNTER — Ambulatory Visit: Payer: 59 | Attending: Obstetrics and Gynecology | Admitting: Genetic Counselor

## 2020-07-02 ENCOUNTER — Other Ambulatory Visit: Payer: Self-pay

## 2020-07-02 DIAGNOSIS — D573 Sickle-cell trait: Secondary | ICD-10-CM | POA: Diagnosis not present

## 2020-07-02 DIAGNOSIS — Z315 Encounter for genetic counseling: Secondary | ICD-10-CM | POA: Diagnosis not present

## 2020-07-02 DIAGNOSIS — O285 Abnormal chromosomal and genetic finding on antenatal screening of mother: Secondary | ICD-10-CM | POA: Diagnosis not present

## 2020-07-02 NOTE — Progress Notes (Signed)
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 Michelle Garrison was accompanied to her appointment by her husband, Rickey Barbara.   Indication for genetic counseling - NIPS high-risk for trisomy 60  Prenatal history  Michelle Garrison is a G1P0000, 30 y.o. female. Her current pregnancy has completed [redacted]w[redacted]d (Estimated Date of Delivery: 12/02/20).  Michelle Garrison denied exposure to environmental toxins or chemical agents. She denied the use of alcohol, tobacco or street drugs. She reported taking prenatal vitamins, folic acid, and Labetalol. She denied significant viral illnesses, fevers, and bleeding during the course of her pregnancy. She reported a personal history of hypertension. Her medical and surgical histories were otherwise noncontributory.  Family History  A three generation pedigree was drafted and reviewed. The family history is remarkable for the following:  - Michelle Garrison and Mr. Jens Som both have family histories of cancer. Michelle Garrison has a maternal half sister to had breast cancer and a mastectomy at age 52, a maternal half brother who was diagnosed with prostate cancer in his early 45s, and another maternal half brother with an unknown type of cancer that was diagnosed at age 72. The father of these individuals had a sister who died of breast cancer in her 67s or 75s. Mr. Jens Som has a paternal aunt who had cancer in her 59s. We reviewed that although most cancers are thought to be sporadic or due to environmental factors, some families appear to have a strong predisposition to cancers. When considering a family history of cancer, we look for common types of cancer in multiple family members occurring at younger than typical  ages. Based on the family history presented today, we discussed that meeting with a cancer genetic counselor would be warranted to discuss any possible screening or testing options available. If Michelle Garrison or Mr. Jens Som are concerned about their family histories of cancer and would like to learn more about their families' chance for an inherited cancer syndrome, they or their healthcare provider may refer them to the [facility] [phone]).   - Michelle Garrison and her partner also have family histories of sickle cell disease. Michelle Garrison maternal half brother has a daughter with sickle cell disease, and Mr. Ludwig Clarks mother has a first cousin once removed with sickle cell disease. Michelle Garrison was found to be a carrier for sickle cell disease on carrier screening. See Discussion section for more details.   - Michelle Garrison has a maternal half sister who had a son die at birth. Michelle Garrison did not have information about the cause of death for this baby; thus, risk assessment was limited.  - Michelle Garrison has a maternal half brother with a learning disability. He did not graduate high school, though Ms. Kuehnle remarked that this was more due to lifestyle choices rather than a reflection of his intelligence. He reportedly looks like the rest of Ms. Kochanski's siblings and has healthy children of his own. We discussed that many times, learning difficulties are multifactorial in nature, occurring due to a combination of genetic and environmental factors that are difficult to identify. Learning disabilities can appear to run in families; thus, there is a chance that the couple's children could also experience learning difficulties of some kind. Without further information about the cause of learning disabilities  in the family, precise risk assessment is limited.  The remaining family histories were reviewed and found to be noncontributory for birth defects, intellectual disability,  recurrent pregnancy loss, and known genetic conditions. Michelle Garrison had limited information about her paternal family history; thus, risk assessment was limited.  The patient's ancestry is [place]. The father of the pregnancy's ancestry is African American. Ashkenazi Jewish ancestry and consanguinity were denied. Pedigree will be scanned under Media.  Discussion  NIPS result:   Michelle Garrison was referred for genetic counseling as results from noninvasive prenatal screening (NIPS) came back positive for an increased risk of trisomy 101, aka Down syndrome, in the current pregnancy. Down syndrome is one of the most common extra chromosome conditions, as approximately 1 in 800 babies are born with this condition. There are different types of Down syndrome, with each type determined by the arrangement of the chromosome 21 pair. Approximately 95% of cases are caused by an entire extra copy of chromosome 21 (trisomy 21), and 2-4% of cases are due to a chromosomal rearrangement (translocation) involving chromosome 21. We reviewed that Down syndrome most commonly occurs by chance due to an error in chromosomal division during the formation of egg and sperm cells in a process called nondisjunction.    Down syndrome is characterized by a distinctive facial appearance, mild to moderate intellectual disability, and an increased chance for a heart defect. Approximately half of babies with Down syndrome are born with a heart defect that may require surgery after birth. While many children with Down syndrome look similar to each other, each child with Down syndrome is unique and will have many more features in common with his or her own family members. Children with Down syndrome also have an increased chance for thyroid problems, which can range from an underactive to an overactive thyroid. Additionally, low muscle tone, gastrointestinal abnormalities, vision problems, and respiratory and ear  infections are more common among babies with Down syndrome. We discussed that there are many more features that can be associated with Down syndrome; however, it is not possible to accurately predict all features that would be present in an individual with Down syndrome prenatally. Additionally, there is a high degree of variability seen among children who have this condition, meaning that every child with Down syndrome will not be affected in exactly the same way, and some children will have more or less features than others. It is not possible to predict what strengths and weaknesses a child with Down syndrome will have, just like it is not possible to predict this for any child.    Michelle Garrison was also counseled that there is an increased fetal loss rate associated with Down syndrome, with up to 30% miscarrying between 12 weeks' gestation and term. Of affected liveborn infants, 90% survive the first 10 years of life. With the advances in medical technology, early intervention, and supportive therapies, many individuals with Down syndrome are able to live with an increasing degree of independence. Today, many adults with Down syndrome care for themselves, have jobs, and often live in group homes or apartments where assistance is available if needed.   We reviewed that NIPS analyzes cell free DNA originating from the placenta that is found in the maternal blood circulation during pregnancy. This test can provide information regarding the presence or absence of extra fetal DNA for chromosomes 13, 18 and 21 as well as the sex chromosomes. The reported detection rate is greater than 99% for trisomy  21, greater than 98% for trisomy 18, greater than 99% for trisomy 13, and greater than 94% for monosomy X. However, it cannot be considered diagnostic for chromosome conditions. Positive predictive value (PPV) is the probability that a pregnancy with a positive test result is truly affected. The PPV reported by the  NIPS laboratory for trisomy 21 in the current pregnancy was estimated to be 95%. The PPV calculator offered by the [employer] and the [employer] estimated PPV for trisomy 21 in the current pregnancy to be 61%. Thus, there is a 61-95% chance that this could be a true positive result.   Michelle Garrison was counseled that there are several possible explanations for her high risk NIPS result. Firstly, the fetus could truly be affected by Down syndrome. Secondly, the fetus could be mosaic for Down syndrome, though this is rare. Mosaicism occurs when an individual has two or more genetically different sets of cells in their body. Individuals who are mosaic for Down syndrome have some cells in the body with trisomy 21 and may have other cells that are chromosomally normal or have a different chromosomal complement. We discussed that it would not be possible to tell which features an individual with mosaic Down syndrome may have, as it is impossible to assess which specific cells and tissues in the body have Down syndrome. Individuals who are mosaic for Down syndrome may be more mildly affected than individuals with full trisomy 21, though this is not always the case. Thirdly, the placenta could have Down syndrome while the fetus could be unaffected. This is a phenomenon known as confined placental mosaicism (CPM). Lastly, this could be a false positive result.  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 We discussed that SCD is one condition in a group of blood disorders that affect hemoglobin in red blood cells (hemoglobinopathies). Hemoglobin is a protein that transports oxygen from the lungs to organs and tissues throughout the body. Individuals with SCD have an inherited structural abnormality in hemoglobin's beta  globin chains due to a single amino acid change in the HBB gene. Instead of producing normal adult hemoglobin (HbA), individuals with SCD produce an atypical form of hemoglobin called hemoglobin S (HbS). Typically, individuals are expected to have two copies of HbA (HbAA). Individuals who are carriers of SCD have one copy of HbA and one copy of HbS (HbAS), whereas individuals affected by SCD have two copies of HbS (HbSS). Carriers of SCD are often said to have sickle cell "trait".   We reviewed that HbS alters the configuration of the hemoglobin molecule. As a result, individuals with SCD have red blood cells that can sickle and obstruct blood flow in small blood vessels, causing ischemia of tissues and organs and episodes of vaso-occlusive crisis. The amino acid change in the HBB gene also causes red blood cells to become fragile and break down easily, which results in chronic anemia. Additional complications associated with SCD may include organ damage, frequent infections, acute chest syndrome, ischemic stroke, splenic sequestration, priapism, and pulmonary hypertension. SCD is inherited in an autosomal recessive pattern, where both parents must carry HbS trait to be at risk of having an affected child. We reviewed that if Michelle Garrison's husband were also a carrier of SCD, the couple would have a 1 in 4 (25%) chance of having a child with  SCD, a 1 in 2 (50%) chance of having an unaffected carrier of sickle cell trait, and a 1 in 4 (25%) chance of having an unaffected child who is not a carrier with each pregnancy. Given his ethnicity, MichelleBarreira's husband has a 1 in 10 chance of carrying HbS trait. Thus, without partner carrier screening to refine risk, the couple's chance of having a child with SCD is 1 in 40 (2.5%).   We reviewed that HbS is just one variant form of hemoglobin caused by a mutation in the HBB gene. It is also possible that Ms. Dilworth's husband could carry another variant form of  hemoglobin, such as hemoglobin C (HbC), or could be a carrier for a variant in the HBB gene associated with beta thalassemia. If he did, the couple would have a 1 in 4 (25%) chance of having a child with another variant hemoglobinopathy. These conditions overlap with SCD, with affected individuals often at risk of the same complications as those associated with SCD, such as pain crises, acute chest syndrome, infections, asplenia, and strokes. However, these complications may occur at a lesser frequency and may be milder depending on the paternal variant.   We discussed that carrier testing for the HBB gene is recommended for Ms. Ahrendt's partner. We also reviewed that select hemoglobinopathies are included on [place]'s newborn screen. Ms. Hauth and her husband indicated that they are not interested in pursuing partner carrier screening. They would prefer to wait to pursue testing postnatally via newborn carrier screening. They understand that they may opt for partner carrier screening at any time should they change their minds.  Ms. Keys carrier screening was negative for the other 3 conditions screened. Thus, her risk to be a carrier for these additional conditions (listed separately in the laboratory report) has been reduced but not eliminated. This also significantly reduces her risk of having a child affected by one of these conditions.   Diagnostic testing:   Ms. Schmieder was counseled regarding diagnostic testing via amniocentesis. We discussed the technical aspects of the procedure and quoted up to a 1 in 500 (0.2%) risk for spontaneous pregnancy loss or other adverse pregnancy outcomes as a result of amniocentesis. Cultured cells from an amniocentesis sample allow for the visualization of a fetal karyotype, which can detect >99% of large chromosomal aberrations, including trisomy 21. Chromosomal microarray can also be performed to identify smaller deletions or duplications of  fetal chromosomal material. Amniocentesis could also determine if the fetus has a hemoglobinopathy.   Ms. Mcgrory was informed that diagnostic testing would be the only way to definitively determine if the fetus has Down syndrome or a hemoglobinopathy such as SCD prenatally. She was also made aware that she has the option of continuing with standard ultrasounds and pursuing genetic testing postnatally. We discussed that ~50% of fetuses with Down syndrome demonstrate signs of the condition on anatomy ultrasound, but that SCD does not present with signs on ultrasound during the prenatal period. Additionally, a fetal echocardiogram for a deeper assessment of cardiac defects is recommended given the increased risk for congenital heart defects associated with Down syndrome.  After careful consideration, Ms. Romick and her husband declined amniocentesis. They understand that amniocentesis remains available at any point throughout the pregnancy and that she may opt to undergo the procedure at a later date should she change her mind..   Plan:  Additional screening and diagnostic testing were declined today. Ms. Insco and her husband informed me that their management of the  pregnancy would not change regardless of whether or not the fetus has Down syndrome or SCD, so they are not interested in pursuing amniocentesis.   I offered to provide the couple with additional resources on Down syndrome today which they declined, preferring to wait until their anatomy ultrasound next week. They understand that screening tests, including ultrasound, cannot rule out all birth defects or genetic syndromes. They are also aware that a normal-appearing ultrasound does not mean that the baby definitively does not have Down syndrome, as 50% of babies with Down syndrome do not demonstrate a sign on prenatal ultrasound. I encouraged the couple to contact me should they have questions or desire additional resources at any  time.  I counseled Ms. Campise regarding the above risks and available options. The approximate face-to-face time with the genetic counselor was 50 minutes.  In summary:  Discussed NIPS result  NIPS high-risk for trisomy 21 (PPV 61-95%)  Reviewed information about Down syndrome, including causes, features, and prognosis  Discussed carrier screening results  Carrier for sickle cell disease  Declined partner carrier screening. Couple prefers to wait until newborn screening to pursue testing  Offered additional testing and screening  Declined amniocentesis  Will return for anatomy ultrasound next week  Recommend fetal echocardiogram. We will place referral at next appointment  Reviewed family history concerns   Gershon CraneHaley E Kaylah Chiasson, MS, [facility] Genetic Counselor

## 2020-07-03 ENCOUNTER — Telehealth: Payer: Self-pay

## 2020-07-03 NOTE — Telephone Encounter (Signed)
Patient was called and offered an appointment for Friday 07/03/20, due to being anxious with the fetus possible having Trisomy 21.  The patient declined the 07/03/20 appointment, because her husband is wanting to attend the ultrasound appointment.

## 2020-07-05 ENCOUNTER — Encounter: Payer: Self-pay | Admitting: Nurse Practitioner

## 2020-07-05 DIAGNOSIS — D573 Sickle-cell trait: Secondary | ICD-10-CM | POA: Insufficient documentation

## 2020-07-06 ENCOUNTER — Other Ambulatory Visit: Payer: Self-pay

## 2020-07-06 ENCOUNTER — Telehealth: Payer: 59 | Admitting: Nurse Practitioner

## 2020-07-06 ENCOUNTER — Encounter: Payer: Self-pay | Admitting: Nurse Practitioner

## 2020-07-06 ENCOUNTER — Telehealth (INDEPENDENT_AMBULATORY_CARE_PROVIDER_SITE_OTHER): Payer: 59 | Admitting: Nurse Practitioner

## 2020-07-06 ENCOUNTER — Other Ambulatory Visit: Payer: Self-pay | Admitting: Nurse Practitioner

## 2020-07-06 VITALS — BP 116/72 | HR 83

## 2020-07-06 DIAGNOSIS — O99891 Other specified diseases and conditions complicating pregnancy: Secondary | ICD-10-CM

## 2020-07-06 DIAGNOSIS — O285 Abnormal chromosomal and genetic finding on antenatal screening of mother: Secondary | ICD-10-CM

## 2020-07-06 DIAGNOSIS — O10919 Unspecified pre-existing hypertension complicating pregnancy, unspecified trimester: Secondary | ICD-10-CM

## 2020-07-06 DIAGNOSIS — O0990 Supervision of high risk pregnancy, unspecified, unspecified trimester: Secondary | ICD-10-CM

## 2020-07-06 DIAGNOSIS — N871 Moderate cervical dysplasia: Secondary | ICD-10-CM

## 2020-07-06 DIAGNOSIS — O0992 Supervision of high risk pregnancy, unspecified, second trimester: Secondary | ICD-10-CM

## 2020-07-06 DIAGNOSIS — O10912 Unspecified pre-existing hypertension complicating pregnancy, second trimester: Secondary | ICD-10-CM

## 2020-07-06 DIAGNOSIS — Z8742 Personal history of other diseases of the female genital tract: Secondary | ICD-10-CM

## 2020-07-06 DIAGNOSIS — Z3A18 18 weeks gestation of pregnancy: Secondary | ICD-10-CM

## 2020-07-06 DIAGNOSIS — J3089 Other allergic rhinitis: Secondary | ICD-10-CM

## 2020-07-06 NOTE — Progress Notes (Signed)
AFP ordered.  To be done as lab only visit when she comes for her ultrasound this week.  Nolene Bernheim, RN, MSN, NP-BC Nurse Practitioner, [facility] for [employer], [facility] 07/06/2020 12:39 PM

## 2020-07-06 NOTE — Progress Notes (Signed)
OBSTETRICS PRENATAL VIRTUAL VISIT ENCOUNTER NOTE  Provider location: [facility] for [facility]   I connected with Michelle Garrison on 07/06/20 at 10:15 AM EST by MyChart Video Encounter at home and verified that I am speaking with the correct person using two identifiers.   I discussed the limitations, risks, security and privacy concerns of performing an evaluation and management service virtually and the availability of in person appointments. I also discussed with the patient that there may be a patient responsible charge related to this service. The patient expressed understanding and agreed to proceed. Subjective:  Michelle Garrison is a 30 y.o. G1P0000 at [redacted]w[redacted]d being seen today for ongoing prenatal care.  She is currently monitored for the following issues for this high-risk pregnancy and has Supervision of high risk pregnancy, antepartum; Chronic hypertension during pregnancy; Infertility, female; History of trichomoniasis; Dysplasia of cervix, high grade CIN 2; Constipation, chronic; Hemorrhoids, external, thrombosed; Pregnant (Dec 2021) and not yet delivered in first trimester; Anal fissure; Abnormal chromosomal and genetic finding on antenatal screening mother; and Sickle cell trait (HCC) on their problem list.  Patient reports rhinits.  Contractions: Not present. Vag. Bleeding: None.  Movement: Present. Denies any leaking of fluid.   The following portions of the patient's history were reviewed and updated as appropriate: allergies, current medications, past family history, past medical history, past social history, past surgical history and problem list.   Objective:   Vitals:   07/06/20 1049  BP: 116/72  Pulse: 83    Fetal Status:     Movement: Present     General:  Alert, oriented and cooperative. Patient is in no acute distress.  Respiratory: Normal respiratory effort, no problems with respiration noted  Mental Status: Normal mood and affect.  Normal behavior. Normal judgment and thought content.  Rest of physical exam deferred due to type of encounter  Imaging: No results found.  Assessment and Plan:  Pregnancy: G1P0000 at [redacted]w[redacted]d 1. Supervision of high risk pregnancy, antepartum Will plan to come for AFP only when she returns for [place] this week.  Order done. Reviewed +GBS and info put in AVS  2. Abnormal chromosomal and genetic finding on antenatal screening mother At risk for Trisomy 67 and has had genetic counseling Did not want amnio for now due to the risk of miscarriage Will wait for ultrasound and see if any signs of Down's Syndrome Also has sickle cell trait and partner declines testing Plans to sign up for childbirth classes  3. Chronic hypertension during pregnancy Taking labatelol and low dose aspirin BPs in babyscripts are normal  4. Non-seasonal allergic rhinitis, unspecified trigger Rhinitis is severe and has been for several months. Has been using generic Afrin spray multiple times a day for months.  Advised to stop this spray and switch to daily Flonase and Zyrtex D every day. Expect it to take a week for the rebound rhinitis from the Afrin spray to resolve. Eventually go to Zyrtec and stop the Zyrtec D  Preterm labor symptoms and general obstetric precautions including but not limited to vaginal bleeding, contractions, leaking of fluid and fetal movement were reviewed in detail with the patient. I discussed the assessment and treatment plan with the patient. The patient was provided an opportunity to ask questions and all were answered. The patient agreed with the plan and demonstrated an understanding of the instructions. The patient was advised to call back or seek an in-person office evaluation/go to [facility] at [facility]  for any urgent or concerning symptoms. Please refer to After Visit Summary for other counseling recommendations.   I provided 10 minutes of face-to-face time during this  encounter.  Return in about 4 weeks (around 08/03/2020) for in person ROB.  Future Appointments  Date Time Provider Department Center  07/08/2020  8:15 AM [facility] NURSE [facility]  07/08/2020  8:30 AM [facility] US3 [facility]  08/03/2020 10:55 AM Burleson, Brand Males, NP [facility]    Currie Paris, NP Center for [employer], [facility]

## 2020-07-06 NOTE — Patient Instructions (Signed)

## 2020-07-06 NOTE — Progress Notes (Signed)
Patient states that she does not feel "flutters" or movement everyday.

## 2020-07-06 NOTE — Progress Notes (Signed)
I connected with  Michelle Garrison on 07/06/20 at 10:15 AM EST by MyChart video visit and verified that I am speaking with the correct person using two identifiers.   I discussed the limitations, risks, security and privacy concerns of performing an evaluation and management service by telephone and the availability of in person appointments. I also discussed with the patient that there may be a patient responsible charge related to this service. The patient expressed understanding and agreed to proceed.  Guy Begin, CMA 07/06/2020  10:44 AM

## 2020-07-08 ENCOUNTER — Other Ambulatory Visit: Payer: Self-pay | Admitting: *Deleted

## 2020-07-08 ENCOUNTER — Encounter: Payer: Self-pay | Admitting: *Deleted

## 2020-07-08 ENCOUNTER — Ambulatory Visit: Payer: 59 | Admitting: *Deleted

## 2020-07-08 ENCOUNTER — Ambulatory Visit: Payer: 59 | Attending: Obstetrics & Gynecology

## 2020-07-08 ENCOUNTER — Other Ambulatory Visit: Payer: Self-pay

## 2020-07-08 ENCOUNTER — Telehealth: Payer: Self-pay | Admitting: Lactation Services

## 2020-07-08 DIAGNOSIS — O0990 Supervision of high risk pregnancy, unspecified, unspecified trimester: Secondary | ICD-10-CM | POA: Insufficient documentation

## 2020-07-08 DIAGNOSIS — O285 Abnormal chromosomal and genetic finding on antenatal screening of mother: Secondary | ICD-10-CM

## 2020-07-08 DIAGNOSIS — N979 Female infertility, unspecified: Secondary | ICD-10-CM | POA: Diagnosis not present

## 2020-07-08 DIAGNOSIS — O10919 Unspecified pre-existing hypertension complicating pregnancy, unspecified trimester: Secondary | ICD-10-CM | POA: Insufficient documentation

## 2020-07-08 DIAGNOSIS — O10912 Unspecified pre-existing hypertension complicating pregnancy, second trimester: Secondary | ICD-10-CM

## 2020-07-08 NOTE — Telephone Encounter (Signed)
Opened in error

## 2020-07-14 ENCOUNTER — Ambulatory Visit: Payer: Self-pay

## 2020-07-15 ENCOUNTER — Encounter (HOSPITAL_BASED_OUTPATIENT_CLINIC_OR_DEPARTMENT_OTHER): Payer: Self-pay

## 2020-07-16 NOTE — Telephone Encounter (Signed)
Can this pt be called with an update about the pediatric cardiology referral?  Thank you.

## 2020-07-20 ENCOUNTER — Encounter: Payer: Self-pay | Admitting: *Deleted

## 2020-08-03 ENCOUNTER — Ambulatory Visit (INDEPENDENT_AMBULATORY_CARE_PROVIDER_SITE_OTHER): Payer: 59 | Admitting: Nurse Practitioner

## 2020-08-03 ENCOUNTER — Other Ambulatory Visit: Payer: Self-pay

## 2020-08-03 VITALS — BP 112/76 | HR 78 | Wt 167.5 lb

## 2020-08-03 DIAGNOSIS — O0990 Supervision of high risk pregnancy, unspecified, unspecified trimester: Secondary | ICD-10-CM

## 2020-08-03 DIAGNOSIS — O2240 Hemorrhoids in pregnancy, unspecified trimester: Secondary | ICD-10-CM

## 2020-08-03 DIAGNOSIS — O285 Abnormal chromosomal and genetic finding on antenatal screening of mother: Secondary | ICD-10-CM

## 2020-08-03 DIAGNOSIS — Z3A22 22 weeks gestation of pregnancy: Secondary | ICD-10-CM

## 2020-08-03 DIAGNOSIS — O10919 Unspecified pre-existing hypertension complicating pregnancy, unspecified trimester: Secondary | ICD-10-CM

## 2020-08-03 NOTE — Progress Notes (Signed)
    Subjective:  Michelle Garrison is a 30 y.o. G1P0000 at [redacted]w[redacted]d being seen today for ongoing prenatal care.  She is currently monitored for the following issues for this high-risk pregnancy and has Supervision of high risk pregnancy, antepartum; Chronic hypertension during pregnancy; Infertility, female; History of trichomoniasis; Dysplasia of cervix, high grade CIN 2; Constipation, chronic; Hemorrhoids, external, thrombosed; Pregnant (Dec 2021) and not yet delivered in first trimester; Anal fissure; Abnormal chromosomal and genetic finding on antenatal screening mother; and Sickle cell trait (HCC) on their problem list.  Patient reports feels the baby move at night.  Contractions: Not present. Vag. Bleeding: None.  Movement: (!) Decreased. Denies leaking of fluid.   The following portions of the patient's history were reviewed and updated as appropriate: allergies, current medications, past family history, past medical history, past social history, past surgical history and problem list. Problem list updated.  Objective:   Vitals:   08/03/20 1055  BP: 112/76  Pulse: 78  Weight: 167 lb 8 oz (76 kg)    Fetal Status: Fetal Heart Rate (bpm): 154 Fundal Height: 25 cm Movement: (!) Decreased     General:  Alert, oriented and cooperative. Patient is in no acute distress.  Skin: Skin is warm and dry. No rash noted.   Cardiovascular: Normal heart rate noted  Respiratory: Normal respiratory effort, no problems with respiration noted  Abdomen: Soft, gravid, appropriate for gestational age. Pain/Pressure: Present     Pelvic:  Cervical exam deferred        Extremities: Normal range of motion.  Edema: None  Mental Status: Normal mood and affect. Normal behavior. Normal judgment and thought content.   Urinalysis:      Assessment and Plan:  Pregnancy: G1P0000 at [redacted]w[redacted]d  1. Supervision of high risk pregnancy, antepartum Observing Ramadan At 22 weeks advised that movements are normal to be  periodic Not having any problems with dizziness or near syncope Eating at night and taking in 64 ounces of water daily Has growth [place] tomorrow Is signed up for childbirth and breastfeeding classes in July Reviewed fasting appoitment for glucola - Ramadan will be finished Reviewed TDAP vaccine recommended to prevent whooping cough for the baby. Had 2 paps done in 2021 - one was normal and one was abnormal. (neither pap results are available in Care Everywhere)   Will need pap PP  - AFP, Serum, Open Spina Bifida  2. Abnormal chromosomal and genetic finding on antenatal screening mother Has echo scheduled April 22 Sent message to Michelle Garrison with [facility] - mother wants to wait until end of April for contact Reviewed BH services available in the office if needed - currently mother is OK and will let us know if she needs additional support  3. Chronic hypertension during pregnancy On medication and low dose aspirin - taking daily Will enter BP into babyscripts weekly  4. Hemorrhoids during pregnancy, antepartum Doing well with taking Miralax daily  Preterm labor symptoms and general obstetric precautions including but not limited to vaginal bleeding, contractions, leaking of fluid and fetal movement were reviewed in detail with the patient. Please refer to After Visit Summary for other counseling recommendations.  Return in 5 weeks (on 09/07/2020) for early AM appt for ROB and fasting glucola.  Nolene Bernheim, RN, MSN, NP-BC Nurse Practitioner, [facility] for [employer], [facility] 08/03/2020 12:04 PM

## 2020-08-03 NOTE — Patient Instructions (Signed)
Tdap (Tetanus, Diphtheria, Pertussis) Vaccine: What You Need to Know 1. Why get vaccinated? Tdap vaccine can prevent tetanus, diphtheria, and pertussis. Diphtheria and pertussis spread from person to person. Tetanus enters the body through cuts or wounds.  TETANUS (T) causes painful stiffening of the muscles. Tetanus can lead to serious health problems, including being unable to open the mouth, having trouble swallowing and breathing, or death.  DIPHTHERIA (D) can lead to difficulty breathing, heart failure, paralysis, or death.  PERTUSSIS (aP), also known as "whooping cough," can cause uncontrollable, violent coughing that makes it hard to breathe, eat, or drink. Pertussis can be extremely serious especially in babies and young children, causing pneumonia, convulsions, brain damage, or death. In teens and adults, it can cause weight loss, loss of bladder control, passing out, and rib fractures from severe coughing. 2. Tdap vaccine Tdap is only for children 7 years and older, adolescents, and adults.  Adolescents should receive a single dose of Tdap, preferably at age 11 or 12 years. Pregnant people should get a dose of Tdap during every pregnancy, preferably during the early part of the third trimester, to help protect the newborn from pertussis. Infants are most at risk for severe, life-threatening complications from pertussis. Adults who have never received Tdap should get a dose of Tdap. Also, adults should receive a booster dose of either Tdap or Td (a different vaccine that protects against tetanus and diphtheria but not pertussis) every 10 years, or after 5 years in the case of a severe or dirty wound or burn. Tdap may be given at the same time as other vaccines. 3. Talk with your health care provider Tell your vaccine provider if the person getting the vaccine:  Has had an allergic reaction after a previous dose of any vaccine that protects against tetanus, diphtheria, or pertussis, or  has any severe, life-threatening allergies  Has had a coma, decreased level of consciousness, or prolonged seizures within 7 days after a previous dose of any pertussis vaccine (DTP, DTaP, or Tdap)  Has seizures or another nervous system problem  Has ever had Guillain-Barr Syndrome (also called "GBS")  Has had severe pain or swelling after a previous dose of any vaccine that protects against tetanus or diphtheria In some cases, your health care provider may decide to postpone Tdap vaccination until a future visit. People with minor illnesses, such as a cold, may be vaccinated. People who are moderately or severely ill should usually wait until they recover before getting Tdap vaccine.  Your health care provider can give you more information. 4. Risks of a vaccine reaction  Pain, redness, or swelling where the shot was given, mild fever, headache, feeling tired, and nausea, vomiting, diarrhea, or stomachache sometimes happen after Tdap vaccination. People sometimes faint after medical procedures, including vaccination. Tell your provider if you feel dizzy or have vision changes or ringing in the ears.  As with any medicine, there is a very remote chance of a vaccine causing a severe allergic reaction, other serious injury, or death. 5. What if there is a serious problem? An allergic reaction could occur after the vaccinated person leaves the clinic. If you see signs of a severe allergic reaction (hives, swelling of the face and throat, difficulty breathing, a fast heartbeat, dizziness, or weakness), call 9-1-1 and get the person to the nearest hospital. For other signs that concern you, call your health care provider.  Adverse reactions should be reported to the Vaccine Adverse Event Reporting System (VAERS). Your health   care provider will usually file this report, or you can do it yourself. Visit the VAERS website at www.vaers.hhs.gov or call 1-800-822-7967. VAERS is only for reporting  reactions, and VAERS staff members do not give medical advice. 6. The National Vaccine Injury Compensation Program The National Vaccine Injury Compensation Program (VICP) is a federal program that was created to compensate people who may have been injured by certain vaccines. Claims regarding alleged injury or death due to vaccination have a time limit for filing, which may be as short as two years. Visit the VICP website at www.hrsa.gov/vaccinecompensation or call 1-800-338-2382 to learn about the program and about filing a claim. 7. How can I learn more?  Ask your health care provider.  Call your local or state health department.  Visit the website of the Food and Drug Administration (FDA) for vaccine package inserts and additional information at www.fda.gov/vaccines-blood-biologics/vaccines.  Contact the Centers for Disease Control and Prevention (CDC): ? Call 1-800-232-4636 (1-800-CDC-INFO) or ? Visit CDC's website at www.cdc.gov/vaccines. Vaccine Information Statement Tdap (Tetanus, Diphtheria, Pertussis) Vaccine (12/06/2019) This information is not intended to replace advice given to you by your health care provider. Make sure you discuss any questions you have with your health care provider. Document Revised: 01/01/2020 Document Reviewed: 01/01/2020 Elsevier Patient Education  2021 Elsevier Inc.  

## 2020-08-05 ENCOUNTER — Ambulatory Visit: Payer: 59 | Attending: Obstetrics and Gynecology

## 2020-08-05 ENCOUNTER — Other Ambulatory Visit: Payer: Self-pay

## 2020-08-05 ENCOUNTER — Encounter: Payer: Self-pay | Admitting: *Deleted

## 2020-08-05 ENCOUNTER — Other Ambulatory Visit: Payer: Self-pay | Admitting: *Deleted

## 2020-08-05 ENCOUNTER — Ambulatory Visit: Payer: 59 | Admitting: *Deleted

## 2020-08-05 DIAGNOSIS — O10912 Unspecified pre-existing hypertension complicating pregnancy, second trimester: Secondary | ICD-10-CM | POA: Diagnosis present

## 2020-08-05 DIAGNOSIS — O281 Abnormal biochemical finding on antenatal screening of mother: Secondary | ICD-10-CM

## 2020-08-05 DIAGNOSIS — O10919 Unspecified pre-existing hypertension complicating pregnancy, unspecified trimester: Secondary | ICD-10-CM | POA: Insufficient documentation

## 2020-08-05 DIAGNOSIS — O10012 Pre-existing essential hypertension complicating pregnancy, second trimester: Secondary | ICD-10-CM

## 2020-08-05 DIAGNOSIS — O0990 Supervision of high risk pregnancy, unspecified, unspecified trimester: Secondary | ICD-10-CM | POA: Insufficient documentation

## 2020-08-05 DIAGNOSIS — Z3A23 23 weeks gestation of pregnancy: Secondary | ICD-10-CM | POA: Diagnosis not present

## 2020-08-05 DIAGNOSIS — O285 Abnormal chromosomal and genetic finding on antenatal screening of mother: Secondary | ICD-10-CM

## 2020-08-05 DIAGNOSIS — N979 Female infertility, unspecified: Secondary | ICD-10-CM

## 2020-08-05 DIAGNOSIS — Z362 Encounter for other antenatal screening follow-up: Secondary | ICD-10-CM | POA: Diagnosis not present

## 2020-08-05 DIAGNOSIS — Z862 Personal history of diseases of the blood and blood-forming organs and certain disorders involving the immune mechanism: Secondary | ICD-10-CM

## 2020-08-11 LAB — AFP, SERUM, OPEN SPINA BIFIDA
AFP MoM: 0.63
AFP Value: 53.9 ng/mL
Gest. Age on Collection Date: 22.5 weeks
Maternal Age At EDD: 30.9 yr
OSBR Risk 1 IN: 10000
Test Results:: NEGATIVE
Weight: 168 [lb_av]

## 2020-09-03 ENCOUNTER — Other Ambulatory Visit: Payer: Self-pay

## 2020-09-03 ENCOUNTER — Ambulatory Visit: Payer: 59 | Attending: Obstetrics

## 2020-09-03 ENCOUNTER — Other Ambulatory Visit: Payer: Self-pay | Admitting: Obstetrics

## 2020-09-03 ENCOUNTER — Encounter: Payer: Self-pay | Admitting: *Deleted

## 2020-09-03 ENCOUNTER — Ambulatory Visit: Payer: 59 | Admitting: *Deleted

## 2020-09-03 ENCOUNTER — Other Ambulatory Visit: Payer: Self-pay | Admitting: *Deleted

## 2020-09-03 DIAGNOSIS — O289 Unspecified abnormal findings on antenatal screening of mother: Secondary | ICD-10-CM

## 2020-09-03 DIAGNOSIS — N979 Female infertility, unspecified: Secondary | ICD-10-CM | POA: Insufficient documentation

## 2020-09-03 DIAGNOSIS — O0990 Supervision of high risk pregnancy, unspecified, unspecified trimester: Secondary | ICD-10-CM

## 2020-09-03 DIAGNOSIS — O285 Abnormal chromosomal and genetic finding on antenatal screening of mother: Secondary | ICD-10-CM | POA: Insufficient documentation

## 2020-09-03 DIAGNOSIS — O10919 Unspecified pre-existing hypertension complicating pregnancy, unspecified trimester: Secondary | ICD-10-CM | POA: Diagnosis present

## 2020-09-03 DIAGNOSIS — Z3A27 27 weeks gestation of pregnancy: Secondary | ICD-10-CM | POA: Insufficient documentation

## 2020-09-03 DIAGNOSIS — O10912 Unspecified pre-existing hypertension complicating pregnancy, second trimester: Secondary | ICD-10-CM | POA: Diagnosis present

## 2020-09-03 DIAGNOSIS — O35BXX0 Maternal care for other (suspected) fetal abnormality and damage, fetal cardiac anomalies, not applicable or unspecified: Secondary | ICD-10-CM

## 2020-09-03 DIAGNOSIS — O358XX0 Maternal care for other (suspected) fetal abnormality and damage, not applicable or unspecified: Secondary | ICD-10-CM | POA: Diagnosis not present

## 2020-09-07 ENCOUNTER — Other Ambulatory Visit: Payer: Self-pay

## 2020-09-07 ENCOUNTER — Ambulatory Visit (INDEPENDENT_AMBULATORY_CARE_PROVIDER_SITE_OTHER): Payer: 59 | Admitting: Nurse Practitioner

## 2020-09-07 ENCOUNTER — Other Ambulatory Visit: Payer: Self-pay | Admitting: *Deleted

## 2020-09-07 ENCOUNTER — Other Ambulatory Visit: Payer: 59

## 2020-09-07 VITALS — BP 128/83 | HR 79 | Wt 170.0 lb

## 2020-09-07 DIAGNOSIS — O285 Abnormal chromosomal and genetic finding on antenatal screening of mother: Secondary | ICD-10-CM

## 2020-09-07 DIAGNOSIS — O0990 Supervision of high risk pregnancy, unspecified, unspecified trimester: Secondary | ICD-10-CM

## 2020-09-07 DIAGNOSIS — O365990 Maternal care for other known or suspected poor fetal growth, unspecified trimester, not applicable or unspecified: Secondary | ICD-10-CM

## 2020-09-07 DIAGNOSIS — K645 Perianal venous thrombosis: Secondary | ICD-10-CM

## 2020-09-07 DIAGNOSIS — Z3A27 27 weeks gestation of pregnancy: Secondary | ICD-10-CM

## 2020-09-07 DIAGNOSIS — O10919 Unspecified pre-existing hypertension complicating pregnancy, unspecified trimester: Secondary | ICD-10-CM

## 2020-09-07 NOTE — Progress Notes (Signed)
    Subjective:  Michelle Garrison is a 30 y.o. G1P0000 at [redacted]w[redacted]d being seen today for ongoing prenatal care.  She is currently monitored for the following issues for this high-risk pregnancy and has Supervision of high risk pregnancy, antepartum; Chronic hypertension during pregnancy; Infertility, female; History of trichomoniasis; Dysplasia of cervix, high grade CIN 2; Constipation, chronic; Hemorrhoids, external, thrombosed; Anal fissure; Abnormal chromosomal and genetic finding on antenatal screening mother; and Sickle cell trait (HCC) on their problem list.  Patient reports feels tired.  Contractions: Not present. Vag. Bleeding: None.  Movement: Present. Denies leaking of fluid.   The following portions of the patient's history were reviewed and updated as appropriate: allergies, current medications, past family history, past medical history, past social history, past surgical history and problem list. Problem list updated.  Objective:   Vitals:   09/07/20 0824  BP: 128/83  Pulse: 79  Weight: 170 lb (77.1 kg)    Fetal Status: Fetal Heart Rate (bpm): 147 Fundal Height: 27 cm Movement: Present     General:  Alert, oriented and cooperative. Patient is in no acute distress.  Skin: Skin is warm and dry. No rash noted.   Cardiovascular: Normal heart rate noted  Respiratory: Normal respiratory effort, no problems with respiration noted  Abdomen: Soft, gravid, appropriate for gestational age. Pain/Pressure: Absent     Pelvic:  Cervical exam deferred        Extremities: Normal range of motion.  Edema: None  Mental Status: Normal mood and affect. Normal behavior. Normal judgment and thought content.   Urinalysis:      Assessment and Plan:  Pregnancy: G1P0000 at [redacted]w[redacted]d  1. Supervision of high risk pregnancy, antepartum Will have glucola done today. Reviewed recommendation for TDAP to protect her baby from whooping cough - wants to review with her husband before taking TDAP - sticky note  revised Is signed up for childbirth classes in July - will try to move those up to May or June  2. 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 Chronic hypertension during pregnancy Labatelol BID and low dose aspirin - taking BP stable today  4. Hemorrhoids, external, thrombosed Has had surgery earlier in her life for this problem Using Miralax, Preparation H cream and suppositories, steriod cream and xylocaine cream.  Having soft stools but they do cause pain.  Preterm labor symptoms and general obstetric precautions including but not limited to vaginal bleeding, contractions, leaking of fluid and fetal movement were reviewed in detail with the patient. Please refer to After Visit Summary for other counseling recommendations.  Return in about 2 weeks (around 09/21/2020) for in person ROB.  Nolene Bernheim, RN, MSN, NP-BC Nurse Practitioner, [facility] for [employer], [facility] 09/07/2020 9:02 AM

## 2020-09-08 LAB — GLUCOSE TOLERANCE, 2 HOURS W/ 1HR
Glucose, 1 hour: 172 mg/dL (ref 65–179)
Glucose, 2 hour: 112 mg/dL (ref 65–152)
Glucose, Fasting: 83 mg/dL (ref 65–91)

## 2020-09-09 LAB — CBC
Hematocrit: 29.8 % — ABNORMAL LOW (ref 34.0–46.6)
Hemoglobin: 10.4 g/dL — ABNORMAL LOW (ref 11.1–15.9)
MCH: 32.8 pg (ref 26.6–33.0)
MCHC: 34.9 g/dL (ref 31.5–35.7)
MCV: 94 fL (ref 79–97)
Platelets: 297 10*3/uL (ref 150–450)
RBC: 3.17 x10E6/uL — ABNORMAL LOW (ref 3.77–5.28)
RDW: 12.5 % (ref 11.7–15.4)
WBC: 6.3 10*3/uL (ref 3.4–10.8)

## 2020-09-09 LAB — RPR: RPR Ser Ql: NONREACTIVE

## 2020-09-09 LAB — AFP, SERUM, OPEN SPINA BIFIDA

## 2020-09-09 LAB — HIV ANTIBODY (ROUTINE TESTING W REFLEX): HIV Screen 4th Generation wRfx: NONREACTIVE

## 2020-09-10 ENCOUNTER — Ambulatory Visit (HOSPITAL_BASED_OUTPATIENT_CLINIC_OR_DEPARTMENT_OTHER): Payer: 59 | Admitting: *Deleted

## 2020-09-10 ENCOUNTER — Other Ambulatory Visit: Payer: Self-pay

## 2020-09-10 ENCOUNTER — Ambulatory Visit: Payer: 59 | Admitting: *Deleted

## 2020-09-10 ENCOUNTER — Ambulatory Visit: Payer: 59 | Attending: Obstetrics and Gynecology

## 2020-09-10 ENCOUNTER — Encounter: Payer: Self-pay | Admitting: *Deleted

## 2020-09-10 DIAGNOSIS — O10919 Unspecified pre-existing hypertension complicating pregnancy, unspecified trimester: Secondary | ICD-10-CM | POA: Insufficient documentation

## 2020-09-10 DIAGNOSIS — O10013 Pre-existing essential hypertension complicating pregnancy, third trimester: Secondary | ICD-10-CM

## 2020-09-10 DIAGNOSIS — O0990 Supervision of high risk pregnancy, unspecified, unspecified trimester: Secondary | ICD-10-CM | POA: Insufficient documentation

## 2020-09-10 DIAGNOSIS — O365990 Maternal care for other known or suspected poor fetal growth, unspecified trimester, not applicable or unspecified: Secondary | ICD-10-CM

## 2020-09-10 DIAGNOSIS — N979 Female infertility, unspecified: Secondary | ICD-10-CM

## 2020-09-10 DIAGNOSIS — O289 Unspecified abnormal findings on antenatal screening of mother: Secondary | ICD-10-CM | POA: Diagnosis not present

## 2020-09-10 DIAGNOSIS — O365930 Maternal care for other known or suspected poor fetal growth, third trimester, not applicable or unspecified: Secondary | ICD-10-CM

## 2020-09-10 DIAGNOSIS — O285 Abnormal chromosomal and genetic finding on antenatal screening of mother: Secondary | ICD-10-CM

## 2020-09-10 DIAGNOSIS — O351XX0 Maternal care for (suspected) chromosomal abnormality in fetus, not applicable or unspecified: Secondary | ICD-10-CM

## 2020-09-10 DIAGNOSIS — O358XX0 Maternal care for other (suspected) fetal abnormality and damage, not applicable or unspecified: Secondary | ICD-10-CM | POA: Diagnosis not present

## 2020-09-10 DIAGNOSIS — Z3A28 28 weeks gestation of pregnancy: Secondary | ICD-10-CM

## 2020-09-10 DIAGNOSIS — Z862 Personal history of diseases of the blood and blood-forming organs and certain disorders involving the immune mechanism: Secondary | ICD-10-CM

## 2020-09-10 MED ORDER — LABETALOL HCL 200 MG PO TABS: 200.0000 mg | ORAL_TABLET | Freq: Two times a day (BID) | ORAL | 2 refills | Status: DC

## 2020-09-10 NOTE — Progress Notes (Signed)
MFM Brief Note  Antenatal testing due to FGR with an EFW 5th% with an AC < 18th%. Biophysical profile 8/8 with good fetal movement and amniotic fluid volume UA Dopplers are elevated with no evidence of AEDF or REDF.  NST was reactive today  Ms. 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 Her blood pressure today is 146/99 and 152/93 mmHg. She reports that she ran out of her medication last night and did not take her morning dose. The pharmacy noted that she could not have another refill until they reached her doctors which would likely be Monday. Therefore, I submitted the refill today to [facility] on [address], Labetalol 200 mg BID. She will go pick up the medication today after her visit.   Lastly, I explained the elevated UA Dopplers today in detail. No change in management is required today. I explained that the UA Dopplers may improve, stay the same or worsen. The weekly testing allows [place] to detect changes that may require management plan changes. If the UA dopplers remain the same or if the EFW < 3% we will recommend delivery by 37 weeks.  She is without signs or symptoms of preeclampsia.   All questions answered.  I spent 30 minutes with > 50% in face to face consultation  Novella Olive, MD.

## 2020-09-10 NOTE — Procedures (Signed)
Michelle Garrison 03/04/1991 [redacted]w[redacted]d  Fetus A Non-Stress Test Interpretation for 09/10/20  Indication: Chronic Hypertenstion  Fetal Heart Rate A Mode: External Baseline Rate (A): 150 bpm Variability: Moderate Accelerations: 10 x 10 Decelerations: Variable Multiple birth?: No  Uterine Activity Mode: Palpation,Toco Contraction Frequency (min): ui Contraction Duration (sec): 20-30 Contraction Quality: Mild Resting Tone Palpated: Relaxed Resting Time: Adequate  Interpretation (Fetal Testing) Nonstress Test Interpretation: Reactive Overall Impression: Reassuring for gestational age Comments: Dr. Grace Bushy reviewed tracing

## 2020-09-11 ENCOUNTER — Other Ambulatory Visit: Payer: Self-pay | Admitting: *Deleted

## 2020-09-11 DIAGNOSIS — O3513X0 Maternal care for (suspected) chromosomal abnormality in fetus, trisomy 21, not applicable or unspecified: Secondary | ICD-10-CM

## 2020-09-17 ENCOUNTER — Ambulatory Visit: Payer: 59 | Admitting: *Deleted

## 2020-09-17 ENCOUNTER — Ambulatory Visit (HOSPITAL_BASED_OUTPATIENT_CLINIC_OR_DEPARTMENT_OTHER): Payer: 59 | Admitting: *Deleted

## 2020-09-17 ENCOUNTER — Other Ambulatory Visit: Payer: Self-pay

## 2020-09-17 ENCOUNTER — Encounter: Payer: Self-pay | Admitting: *Deleted

## 2020-09-17 ENCOUNTER — Ambulatory Visit: Payer: 59 | Attending: Obstetrics and Gynecology

## 2020-09-17 ENCOUNTER — Other Ambulatory Visit: Payer: Self-pay | Admitting: Obstetrics and Gynecology

## 2020-09-17 DIAGNOSIS — O351XX0 Maternal care for (suspected) chromosomal abnormality in fetus, not applicable or unspecified: Secondary | ICD-10-CM | POA: Diagnosis not present

## 2020-09-17 DIAGNOSIS — Z862 Personal history of diseases of the blood and blood-forming organs and certain disorders involving the immune mechanism: Secondary | ICD-10-CM

## 2020-09-17 DIAGNOSIS — O10919 Unspecified pre-existing hypertension complicating pregnancy, unspecified trimester: Secondary | ICD-10-CM | POA: Insufficient documentation

## 2020-09-17 DIAGNOSIS — O365990 Maternal care for other known or suspected poor fetal growth, unspecified trimester, not applicable or unspecified: Secondary | ICD-10-CM

## 2020-09-17 DIAGNOSIS — N979 Female infertility, unspecified: Secondary | ICD-10-CM | POA: Insufficient documentation

## 2020-09-17 DIAGNOSIS — O35BXX0 Maternal care for other (suspected) fetal abnormality and damage, fetal cardiac anomalies, not applicable or unspecified: Secondary | ICD-10-CM

## 2020-09-17 DIAGNOSIS — O365930 Maternal care for other known or suspected poor fetal growth, third trimester, not applicable or unspecified: Secondary | ICD-10-CM

## 2020-09-17 DIAGNOSIS — O358XX0 Maternal care for other (suspected) fetal abnormality and damage, not applicable or unspecified: Secondary | ICD-10-CM | POA: Diagnosis not present

## 2020-09-17 DIAGNOSIS — O10013 Pre-existing essential hypertension complicating pregnancy, third trimester: Secondary | ICD-10-CM | POA: Diagnosis not present

## 2020-09-17 DIAGNOSIS — Z3A29 29 weeks gestation of pregnancy: Secondary | ICD-10-CM

## 2020-09-17 DIAGNOSIS — O0990 Supervision of high risk pregnancy, unspecified, unspecified trimester: Secondary | ICD-10-CM | POA: Diagnosis present

## 2020-09-17 DIAGNOSIS — O285 Abnormal chromosomal and genetic finding on antenatal screening of mother: Secondary | ICD-10-CM | POA: Diagnosis present

## 2020-09-17 NOTE — Procedures (Signed)
Michelle Garrison 1990/12/16 [redacted]w[redacted]d  Fetus A Non-Stress Test Interpretation for 09/17/20  Indication: Fetal CHD, HR T21  Fetal Heart Rate A Mode: External Baseline Rate (A): 145 bpm Variability: Moderate Accelerations: None Decelerations: None Multiple birth?: No  Uterine Activity Mode: Palpation,Toco Contraction Frequency (min): Occas UI Contraction Quality: Mild Resting Tone Palpated: Relaxed Resting Time: Adequate  Interpretation (Fetal Testing) Nonstress Test Interpretation: Non-reactive Overall Impression: Reassuring for gestational age Comments: Dr. Grace Bushy reviewed tracing. Pt to have U/S as scheduled.

## 2020-09-22 ENCOUNTER — Ambulatory Visit (INDEPENDENT_AMBULATORY_CARE_PROVIDER_SITE_OTHER): Payer: 59 | Admitting: Women's Health

## 2020-09-22 ENCOUNTER — Other Ambulatory Visit (HOSPITAL_COMMUNITY)
Admission: RE | Admit: 2020-09-22 | Discharge: 2020-09-22 | Disposition: A | Discharge status: Home / Self Care | Payer: 59 | Source: Ambulatory Visit | Attending: Women's Health | Admitting: Women's Health

## 2020-09-22 ENCOUNTER — Other Ambulatory Visit: Payer: Self-pay

## 2020-09-22 VITALS — BP 125/75 | HR 70 | Wt 167.1 lb

## 2020-09-22 DIAGNOSIS — Z3A29 29 weeks gestation of pregnancy: Secondary | ICD-10-CM

## 2020-09-22 DIAGNOSIS — O26849 Uterine size-date discrepancy, unspecified trimester: Secondary | ICD-10-CM | POA: Insufficient documentation

## 2020-09-22 DIAGNOSIS — N898 Other specified noninflammatory disorders of vagina: Secondary | ICD-10-CM | POA: Diagnosis present

## 2020-09-22 DIAGNOSIS — D573 Sickle-cell trait: Secondary | ICD-10-CM

## 2020-09-22 DIAGNOSIS — O26843 Uterine size-date discrepancy, third trimester: Secondary | ICD-10-CM

## 2020-09-22 DIAGNOSIS — O285 Abnormal chromosomal and genetic finding on antenatal screening of mother: Secondary | ICD-10-CM

## 2020-09-22 DIAGNOSIS — O0990 Supervision of high risk pregnancy, unspecified, unspecified trimester: Secondary | ICD-10-CM

## 2020-09-22 DIAGNOSIS — N871 Moderate cervical dysplasia: Secondary | ICD-10-CM

## 2020-09-22 DIAGNOSIS — O10919 Unspecified pre-existing hypertension complicating pregnancy, unspecified trimester: Secondary | ICD-10-CM

## 2020-09-22 NOTE — Progress Notes (Signed)
Subjective:  Michelle Garrison is a 30 y.o. G1P0000 at [redacted]w[redacted]d being seen today for ongoing prenatal care.  She is currently monitored for the following issues for this high-risk pregnancy and has Supervision of high risk pregnancy, antepartum; Chronic hypertension during pregnancy; Infertility, female; History of trichomoniasis; Dysplasia of cervix, high grade CIN 2; Constipation, chronic; Hemorrhoids, external, thrombosed; Anal fissure; Abnormal chromosomal and genetic finding on antenatal screening mother; Sickle cell trait (HCC); and Uterine size date discrepancy on their problem list.  Patient reports vaginal itching without odor.  Contractions: Not present. Vag. Bleeding: None.  Movement: Present. Denies leaking of fluid.   The following portions of the patient's history were reviewed and updated as appropriate: allergies, current medications, past family history, past medical history, past social history, past surgical history and problem list. Problem list updated.  Objective:   Vitals:   09/22/20 1001  BP: 125/75  Pulse: 70  Weight: 167 lb 1.6 oz (75.8 kg)    Fetal Status: Fetal Heart Rate (bpm): 145 Fundal Height: 27 cm Movement: Present     General:  Alert, oriented and cooperative. Patient is in no acute distress.  Skin: Skin is warm and dry. No rash noted.   Cardiovascular: Normal heart rate noted  Respiratory: Normal respiratory effort, no problems with respiration noted  Abdomen: Soft, gravid, appropriate for gestational age. Pain/Pressure: Present     Pelvic: Vag. Bleeding: None     Cervical exam deferred        Extremities: Normal range of motion.  Edema: None  Mental Status: Normal mood and affect. Normal behavior. Normal judgment and thought content.   Urinalysis:      Assessment and Plan:  Pregnancy: G1P0000 at [redacted]w[redacted]d  1. Supervision of high risk pregnancy, antepartum - peds list given - CBE info given - S<D having growth scans/Dopplers with MFM  2. Vaginal  itching - itching without odor - pt describes discharge as smooth and white - Cervicovaginal ancillary only( [place])  3. Sickle cell trait (HCC) - urine culture today  4. Abnormal chromosomal and genetic finding on antenatal screening mother - GC 06/2020, declined amnio  5. Dysplasia of cervix, high grade CIN 2 -lGSIL pap 10/2019, Colpo with CIN 2, needs PP pap  6. Chronic hypertension during pregnancy -BP 125/75 today  7. [redacted] weeks gestation of pregnancy  Preterm labor symptoms and general obstetric precautions including but not limited to vaginal bleeding, contractions, leaking of fluid and fetal movement were reviewed in detail with the patient. I discussed the assessment and treatment plan with the patient. The patient was provided an opportunity to ask questions and all were answered. The patient agreed with the plan and demonstrated an understanding of the instructions. The patient was advised to call back or seek an in-person office evaluation/go to [facility] at [facility] for any urgent or concerning symptoms. Please refer to After Visit Summary for other counseling recommendations.  Return in about 2 weeks (around 10/06/2020) for in-person HOB/APP OK.   Landrie Beale, Odie Sera, NP

## 2020-09-22 NOTE — Patient Instructions (Addendum)
Maternity Assessment Unit (MAU)  The Maternity Assessment Unit (MAU) is located at the [facility] and [place] [facility]. The address is: [address], [place], [place], [place] [address]. Please see map below for additional directions.    The Maternity Assessment Unit is designed to help you during your pregnancy, and for up to 6 weeks after delivery, with any pregnancy- or postpartum-related emergencies, if you think you are in labor, or if your water has broken. For example, if you experience nausea and vomiting, vaginal bleeding, severe abdominal or pelvic pain, elevated blood pressure or other problems related to your pregnancy or postpartum time, please come to the Maternity Assessment Unit for assistance.        Preterm Labor The normal length of a pregnancy is 39-41 weeks. Preterm labor is when labor starts before 37 completed weeks of pregnancy. Babies who are born prematurely and survive may not be fully developed and may be at an increased risk for long-term problems such as cerebral palsy, developmental delays, and vision and hearing problems. Babies who are born too early may have problems soon after birth. Problems may include regulating blood sugar, body temperature, heart rate, and breathing rate. These babies often have trouble with feeding. The risk of having problems is highest for babies who are born before 44 weeks of pregnancy. What are the causes? The exact cause of this condition is not known. What increases the risk? You are more likely to have preterm labor if you have certain risk factors that relate to your medical history, problems with present and past pregnancies, and lifestyle factors. Medical history  You have abnormalities of the uterus, including a short cervix.  You have STIs (sexually transmitted infections), or other infections of the urinary tract and the vagina.  You have chronic illnesses, such as blood clotting problems,  diabetes, or high blood pressure.  You are overweight or underweight. Present and past pregnancies  You have had preterm labor before.  You are pregnant with twins or other multiples.  You have been diagnosed with a condition in which the placenta covers your cervix (placenta previa).  You waited less than 6 months between giving birth and becoming pregnant again.  Your unborn baby has some abnormalities.  You have vaginal bleeding during pregnancy.  You became pregnant through in vitro fertilization (IVF). Lifestyle and environmental factors  You use tobacco products.  You drink alcohol.  You use street drugs.  You have stress and no social support.  You experience domestic violence.  You are exposed to certain chemicals or environmental pollutants. Other factors  You are younger than age 110 or older than age 19. What are the signs or symptoms? Symptoms of this condition include:  Cramps similar to those that can happen during a menstrual period. The cramps may happen with diarrhea.  Pain in the abdomen or lower back.  Regular contractions that may feel like tightening of the abdomen.  A feeling of increased pressure in the pelvis.  Increased watery or bloody mucus discharge from the vagina.  Water breaking (ruptured amniotic sac). How is this diagnosed? This condition is diagnosed based on:  Your medical history and a physical exam.  A pelvic exam.  An ultrasound.  Monitoring your uterus for contractions.  Other tests, including: ? A swab of the cervix to check for a chemical called fetal fibronectin. ? Urine tests. How is this treated? Treatment for this condition depends on the length of your pregnancy, your  condition, and the health of your baby. Treatment may include:  Taking medicines, such as: ? Hormone medicines. These may be given early in pregnancy to help support the pregnancy. ? Medicines to stop contractions. ? Medicines to help mature  the baby's lungs. These may be prescribed if the risk of delivery is high. ? Medicines to prevent your baby from developing cerebral palsy.  Bed rest. If the labor happens before 34 weeks of pregnancy, you may need to stay in the hospital.  Delivery of the baby. Follow these instructions at home:  Do not use any products that contain nicotine or tobacco, such as cigarettes, e-cigarettes, and chewing tobacco. If you need help quitting, ask your health care provider.  Do not drink alcohol.  Take over-the-counter and prescription medicines only as told by your health care provider.  Rest as told by your health care provider.  Return to your normal activities as told by your health care provider. Ask your health care provider what activities are safe for you.  Keep all follow-up visits as told by your health care provider. This is important.   How is this prevented? To increase your chance of having a full-term pregnancy:  Do not use street drugs or medicines that have not been prescribed to you during your pregnancy.  Talk with your health care provider before taking any herbal supplements, even if you have been taking them regularly.  Make sure you gain a healthy amount of weight during your pregnancy.  Watch for infection. If you think that you might have an infection, get it checked right away. Symptoms of infection may include: ? Fever. ? Abnormal vaginal discharge or discharge that smells bad. ? Pain or burning with urination. ? Needing to urinate urgently. ? Frequently urinating or passing small amounts of urine frequently. ? Blood in your urine. ? Urine that smells bad or unusual.  Tell your health care provider if you have had preterm labor before. Contact a health care provider if:  You think you are going into preterm labor.  You have signs or symptoms of preterm labor.  You have symptoms of infection. Get help right away if:  You are having regular, painful  contractions every 5 minutes or less.  Your water breaks. Summary  Preterm labor is labor that starts before you reach 37 weeks of pregnancy.  Delivering your baby early increases your baby's risk of developing lifelong problems.  The exact cause of preterm labor is unknown. However, having an abnormal uterus, an STI (sexually transmitted infection), or vaginal bleeding during pregnancy increases your risk for preterm labor.  Keep all follow-up visits as told by your health care provider. This is important.  Contact a health care provider if you have signs or symptoms of preterm labor. This information is not intended to replace advice given to you by your health care provider. Make sure you discuss any questions you have with your health care provider. Document Revised: 05/21/2019 Document Reviewed: 05/21/2019 Elsevier Patient Education  2021 Elsevier Inc.        Hypertension During Pregnancy High blood pressure (hypertension) is when the force of blood pumping through the arteries is high enough to cause problems with your health. Arteries are blood vessels that carry blood from the heart throughout the body. Hypertension during pregnancy can cause problems for you and your baby. It can be mild or severe. There are different types of hypertension that can happen during pregnancy. These include: Chronic hypertension. This happens when you  had high blood pressure before you became pregnant, and it continues during the pregnancy. Hypertension that develops before you are [redacted] weeks pregnant and continues during the pregnancy is also called chronic hypertension. If you have chronic hypertension, it will not go away after you have your baby. You will need follow-up visits with your health care provider after you have your baby. Your health care provider may want you to keep taking medicine for your blood pressure. Gestational hypertension. This is hypertension that develops after the 20th  week of pregnancy. Gestational hypertension usually goes away after you have your baby, but your health care provider will need to monitor your blood pressure to make sure that it is getting better. Postpartum hypertension. This is high blood pressure that was present before delivery and continues after delivery or that starts after delivery. This usually occurs within 48 hours after childbirth but may occur up to 6 weeks after giving birth. When hypertension during pregnancy is severe, it is a medical emergency that requires treatment right away. How does this affect me? Women who have hypertension during pregnancy have a greater chance of developing hypertension later in life or during future pregnancies. In some cases, hypertension during pregnancy can cause serious complications, such as: Stroke. Heart attack. Injury to other organs, such as kidneys, lungs, or liver. Preeclampsia. A condition called hemolysis, elevated liver enzymes, and low platelet count (HELLP) syndrome. Convulsions or seizures. Placental abruption. How does this affect my baby? Hypertension during pregnancy can affect your baby. Your baby may: Be born early (prematurely). Not weigh as much as he or she should at birth (low birth weight). Not tolerate labor well, leading to an unplanned cesarean delivery. This condition may also result in a baby's death before birth (stillbirth). What are the risks? There are certain factors that make it more likely for you to develop hypertension during pregnancy. These include: Having hypertension during a previous pregnancy or a family history of hypertension. Being overweight. Being age 63 or older. Being pregnant for the first time. Being pregnant with more than one baby. Becoming pregnant using fertilization methods, such as IVF (in vitro fertilization). Having other medical problems, such as diabetes, kidney disease, or lupus. What can I do to lower my risk? The exact cause  of hypertension during pregnancy is not known. You may be able to lower your risk by: Maintaining a healthy weight. Eating a healthy and balanced diet. Following your health care provider's instructions about treating any long-term conditions that you had before becoming pregnant. It is very important to keep all of your prenatal care appointments. Your health care provider will check your blood pressure and make sure that your pregnancy is progressing as expected. If a problem is found, early treatment can prevent complications.   How is this treated? Treatment for hypertension during pregnancy varies depending on the type of hypertension you have and how serious it is. If you were taking medicine for high blood pressure before you became pregnant, talk with your health care provider. You may need to change medicine during pregnancy because some medicines, like ACE inhibitors, may not be considered safe for your baby. If you have gestational hypertension, your health care provider may order medicine to treat this during pregnancy. If you are at risk for preeclampsia, your health care provider may recommend that you take a low-dose aspirin during your pregnancy. If you have severe hypertension, you may need to be hospitalized so you and your baby can be monitored closely. You  may also need to be given medicine to lower your blood pressure. In some cases, if your condition gets worse, you may need to deliver your baby early. Follow these instructions at home: Eating and drinking Drink enough fluid to keep your urine pale yellow. Avoid caffeine.   Lifestyle Do not use any products that contain nicotine or tobacco. These products include cigarettes, chewing tobacco, and vaping devices, such as e-cigarettes. If you need help quitting, ask your health care provider. Do not use alcohol or drugs. Avoid stress as much as possible. Rest and get plenty of sleep. Regular exercise can help to reduce your  blood pressure. Ask your health care provider what kinds of exercise are best for you. General instructions Take over-the-counter and prescription medicines only as told by your health care provider. Keep all prenatal and follow-up visits. This is important. Contact a health care provider if: You have symptoms that your health care provider told you may require more treatment or monitoring, such as: Headaches. Nausea or vomiting. Abdominal pain. Dizziness. Light-headedness. Get help right away if: You have symptoms of serious complications, such as: Severe abdominal pain that does not get better with treatment. A severe headache that does not get better, blurred vision, or double vision. Vomiting that does not get better. Sudden, rapid weight gain or swelling in your hands, ankles, or face. Vaginal bleeding. Blood in your urine. Shortness of breath or chest pain. Weakness on one side of your body or difficulty speaking. Your baby is not moving as much as usual. These symptoms may represent a serious problem that is an emergency. Do not wait to see if the symptoms will go away. Get medical help right away. Call your local emergency services (911 in the [place] Do not drive yourself to the hospital. Summary Hypertension during pregnancy can cause problems for you and your baby. Treatment for hypertension during pregnancy varies depending on the type of hypertension you have and how serious it is. Keep all prenatal and follow-up visits. This is important. Get help right away if you have symptoms of serious complications related to high blood pressure. This information is not intended to replace advice given to you by your health care provider. Make sure you discuss any questions you have with your health care provider. Document Revised: 01/09/2020 Document Reviewed: 01/09/2020 Elsevier Patient Education  2021 Elsevier  Inc.        [email or web address]">  Third Trimester of Pregnancy  The third trimester of pregnancy is from week 28 through week 40. This is months 7 through 9. The third trimester is a time when the unborn baby (fetus) is growing rapidly. At the end of the ninth month, the fetus is about 20 inches long and weighs 6-10 pounds. Body changes during your third trimester During the third trimester, your body will continue to go through many changes. The changes vary and generally return to normal after your baby is born. Physical changes  Your weight will continue to increase. You can expect to gain 25-35 pounds (11-16 kg) by the end of the pregnancy if you begin pregnancy at a normal weight. If you are underweight, you can expect to gain 28-40 lb (about 13-18 kg), and if you are overweight, you can expect to gain 15-25 lb (about 7-11 kg).  You may begin to get stretch marks on your hips, abdomen, and breasts.  Your breasts will continue to grow and may hurt. A yellow fluid (colostrum) may leak from your breasts. This is the first  milk you are producing for your baby.  You may have changes in your hair. These can include thickening of your hair, rapid growth, and changes in texture. Some people also have hair loss during or after pregnancy, or hair that feels dry or thin.  Your belly button may stick out.  You may notice more swelling in your hands, face, or ankles. Health changes  You may have heartburn.  You may have constipation.  You may develop hemorrhoids.  You may develop swollen, bulging veins (varicose veins) in your legs.  You may have increased body aches in the pelvis, back, or thighs. This is due to weight gain and increased hormones that are relaxing your joints.  You may have increased tingling or numbness in your hands, arms, and legs. The skin on your abdomen may also feel numb.  You may feel short of breath  because of your expanding uterus. Other changes  You may urinate more often because the fetus is moving lower into your pelvis and pressing on your bladder.  You may have more problems sleeping. This may be caused by the size of your abdomen, an increased need to urinate, and an increase in your body's metabolism.  You may notice the fetus "dropping," or moving lower in your abdomen (lightening).  You may have increased vaginal discharge.  You may notice that you have pain around your pelvic bone as your uterus distends. Follow these instructions at home: Medicines  Follow your health care provider's instructions regarding medicine use. Specific medicines may be either safe or unsafe to take during pregnancy. Do not take any medicines unless approved by your health care provider.  Take a prenatal vitamin that contains at least 600 micrograms (mcg) of folic acid. Eating and drinking  Eat a healthy diet that includes fresh fruits and vegetables, whole grains, good sources of protein such as meat, eggs, or tofu, and low-fat dairy products.  Avoid raw meat and unpasteurized juice, milk, and cheese. These carry germs that can harm you and your baby.  Eat 4 or 5 small meals rather than 3 large meals a day.  You may need to take these actions to prevent or treat constipation: ? Drink enough fluid to keep your urine pale yellow. ? Eat foods that are high in fiber, such as beans, whole grains, and fresh fruits and vegetables. ? Limit foods that are high in fat and processed sugars, such as fried or sweet foods. Activity  Exercise only as directed by your health care provider. Most people can continue their usual exercise routine during pregnancy. Try to exercise for 30 minutes at least 5 days a week. Stop exercising if you experience contractions in the uterus.  Stop exercising if you develop pain or cramping in the lower abdomen or lower back.  Avoid heavy lifting.  Do not exercise if  it is very hot or humid or if you are at a high altitude.  If you choose to, you may continue to have sex unless your health care provider tells you not to. Relieving pain and discomfort  Take frequent breaks and rest with your legs raised (elevated) if you have leg cramps or low back pain.  Take warm sitz baths to soothe any pain or discomfort caused by hemorrhoids. Use hemorrhoid cream if your health care provider approves.  Wear a supportive bra to prevent discomfort from breast tenderness.  If you develop varicose veins: ? Wear support hose as told by your health care provider. ? Elevate  your feet for 15 minutes, 3-4 times a day. ? Limit salt in your diet. Safety  Talk to your health care provider before traveling far distances.  Do not use hot tubs, steam rooms, or saunas.  Wear your seat belt at all times when driving or riding in a car.  Talk with your health care provider if someone is verbally or physically abusive to you. Preparing for birth To prepare for the arrival of your baby:  Take prenatal classes to understand, practice, and ask questions about labor and delivery.  Visit the hospital and tour the maternity area.  Purchase a rear-facing car seat and make sure you know how to install it in your car.  Prepare the baby's room or sleeping area. Make sure to remove all pillows and stuffed animals from the baby's crib to prevent suffocation. General instructions  Avoid cat litter boxes and soil used by cats. These carry germs that can cause birth defects in the baby. If you have a cat, ask someone to clean the litter box for you.  Do not douche or use tampons. Do not use scented sanitary pads.  Do not use any products that contain nicotine or tobacco, such as cigarettes, e-cigarettes, and chewing tobacco. If you need help quitting, ask your health care provider.  Do not use any herbal remedies, illegal drugs, or medicines that were not prescribed to you. Chemicals  in these products can harm your baby.  Do not drink alcohol.  You will have more frequent prenatal exams during the third trimester. During a routine prenatal visit, your health care provider will do a physical exam, perform tests, and discuss your overall health. Keep all follow-up visits. This is important. Where to find more information  American Pregnancy Association: americanpregnancy.org  [employer]: https://www.todd-brady.net/  Office on [employer] Health: Might[employer]ard.co.nz Contact a health care provider if you have:  A fever.  Mild pelvic cramps, pelvic pressure, or nagging pain in your abdominal area or lower back.  Vomiting or diarrhea.  Bad-smelling vaginal discharge or foul-smelling urine.  Pain when you urinate.  A headache that does not go away when you take medicine.  Visual changes or see spots in front of your eyes. Get help right away if:  Your water breaks.  You have regular contractions less than 5 minutes apart.  You have spotting or bleeding from your vagina.  You have severe abdominal pain.  You have difficulty breathing.  You have chest pain.  You have fainting spells.  You have not felt your baby move for the time period told by your health care provider.  You have new or increased pain, swelling, or redness in an arm or leg. Summary  The third trimester of pregnancy is from week 28 through week 40 (months 7 through 9).  You may have more problems sleeping. This can be caused by the size of your abdomen, an increased need to urinate, and an increase in your body's metabolism.  You will have more frequent prenatal exams during the third trimester. Keep all follow-up visits. This is important. This information is not intended to replace advice given to you by your health care provider. Make sure you discuss any questions you have with your health care provider. Document Revised:  09/25/2019 Document Reviewed: 08/01/2019 Elsevier Patient Education  2021 [employer].

## 2020-09-22 NOTE — Progress Notes (Signed)
Pt states is having a lot of d/c with itching, no odor.

## 2020-09-23 ENCOUNTER — Other Ambulatory Visit: Payer: Self-pay | Admitting: *Deleted

## 2020-09-23 DIAGNOSIS — B9689 Other specified bacterial agents as the cause of diseases classified elsewhere: Secondary | ICD-10-CM

## 2020-09-23 DIAGNOSIS — N760 Acute vaginitis: Secondary | ICD-10-CM

## 2020-09-23 LAB — CERVICOVAGINAL ANCILLARY ONLY
Bacterial Vaginitis (gardnerella): POSITIVE — AB
Candida Glabrata: NEGATIVE
Candida Vaginitis: POSITIVE — AB
Chlamydia: NEGATIVE
Comment: NEGATIVE
Comment: NEGATIVE
Comment: NEGATIVE
Comment: NEGATIVE
Comment: NEGATIVE
Comment: NORMAL
Neisseria Gonorrhea: NEGATIVE
Trichomonas: NEGATIVE

## 2020-09-23 MED ORDER — METRONIDAZOLE 500 MG PO TABS: 500.0000 mg | ORAL_TABLET | Freq: Two times a day (BID) | ORAL | 0 refills | Status: DC

## 2020-09-24 ENCOUNTER — Ambulatory Visit: Payer: 59 | Attending: Obstetrics and Gynecology

## 2020-09-24 ENCOUNTER — Other Ambulatory Visit: Payer: Self-pay

## 2020-09-24 ENCOUNTER — Ambulatory Visit: Payer: 59 | Admitting: *Deleted

## 2020-09-24 ENCOUNTER — Other Ambulatory Visit: Payer: Self-pay | Admitting: Obstetrics and Gynecology

## 2020-09-24 ENCOUNTER — Encounter: Payer: Self-pay | Admitting: *Deleted

## 2020-09-24 ENCOUNTER — Other Ambulatory Visit: Payer: Self-pay | Admitting: Women's Health

## 2020-09-24 DIAGNOSIS — O10013 Pre-existing essential hypertension complicating pregnancy, third trimester: Secondary | ICD-10-CM | POA: Diagnosis not present

## 2020-09-24 DIAGNOSIS — O285 Abnormal chromosomal and genetic finding on antenatal screening of mother: Secondary | ICD-10-CM | POA: Insufficient documentation

## 2020-09-24 DIAGNOSIS — B373 Candidiasis of vulva and vagina: Secondary | ICD-10-CM

## 2020-09-24 DIAGNOSIS — O26843 Uterine size-date discrepancy, third trimester: Secondary | ICD-10-CM

## 2020-09-24 DIAGNOSIS — O365930 Maternal care for other known or suspected poor fetal growth, third trimester, not applicable or unspecified: Secondary | ICD-10-CM

## 2020-09-24 DIAGNOSIS — O365990 Maternal care for other known or suspected poor fetal growth, unspecified trimester, not applicable or unspecified: Secondary | ICD-10-CM | POA: Insufficient documentation

## 2020-09-24 DIAGNOSIS — O10919 Unspecified pre-existing hypertension complicating pregnancy, unspecified trimester: Secondary | ICD-10-CM | POA: Insufficient documentation

## 2020-09-24 DIAGNOSIS — Z3A30 30 weeks gestation of pregnancy: Secondary | ICD-10-CM

## 2020-09-24 DIAGNOSIS — N979 Female infertility, unspecified: Secondary | ICD-10-CM | POA: Diagnosis present

## 2020-09-24 DIAGNOSIS — O351XX0 Maternal care for (suspected) chromosomal abnormality in fetus, not applicable or unspecified: Secondary | ICD-10-CM | POA: Diagnosis not present

## 2020-09-24 DIAGNOSIS — O0990 Supervision of high risk pregnancy, unspecified, unspecified trimester: Secondary | ICD-10-CM

## 2020-09-24 DIAGNOSIS — O35BXX0 Maternal care for other (suspected) fetal abnormality and damage, fetal cardiac anomalies, not applicable or unspecified: Secondary | ICD-10-CM

## 2020-09-24 DIAGNOSIS — B3731 Acute candidiasis of vulva and vagina: Secondary | ICD-10-CM

## 2020-09-24 DIAGNOSIS — O358XX0 Maternal care for other (suspected) fetal abnormality and damage, not applicable or unspecified: Secondary | ICD-10-CM

## 2020-09-24 DIAGNOSIS — Z862 Personal history of diseases of the blood and blood-forming organs and certain disorders involving the immune mechanism: Secondary | ICD-10-CM

## 2020-09-24 MED ORDER — TERCONAZOLE 0.4 % VA CREA: 1.0000 | TOPICAL_CREAM | Freq: Every day | VAGINAL | 0 refills | Status: AC

## 2020-09-24 NOTE — Procedures (Signed)
Michelle Garrison 02-01-1991 [redacted]w[redacted]d  Fetus A Non-Stress Test Interpretation for 09/24/20  Indication: T21, Fetal CHD  Fetal Heart Rate A Mode: External Baseline Rate (A): 140 bpm Variability: Moderate Accelerations: 10 x 10 Decelerations: None Multiple birth?: No  Uterine Activity Mode: Palpation,Toco Contraction Frequency (min): UI Contraction Quality: Mild Resting Tone Palpated: Relaxed Resting Time: Adequate  Interpretation (Fetal Testing) Nonstress Test Interpretation: Reactive Overall Impression: Reassuring for gestational age Comments: Dr. Judeth Cornfield reviewed tracing.

## 2020-09-25 LAB — URINE CULTURE

## 2020-09-28 ENCOUNTER — Other Ambulatory Visit: Payer: Self-pay

## 2020-09-28 ENCOUNTER — Encounter (HOSPITAL_COMMUNITY): Payer: Self-pay | Admitting: Obstetrics and Gynecology

## 2020-09-28 ENCOUNTER — Inpatient Hospital Stay (HOSPITAL_COMMUNITY)
Admission: AD | Admit: 2020-09-28 | Discharge: 2020-09-28 | Disposition: A | Discharge status: Home / Self Care | Payer: 59 | Attending: Obstetrics and Gynecology | Admitting: Obstetrics and Gynecology

## 2020-09-28 DIAGNOSIS — Z881 Allergy status to other antibiotic agents status: Secondary | ICD-10-CM | POA: Diagnosis not present

## 2020-09-28 DIAGNOSIS — Z886 Allergy status to analgesic agent status: Secondary | ICD-10-CM | POA: Insufficient documentation

## 2020-09-28 DIAGNOSIS — Z79899 Other long term (current) drug therapy: Secondary | ICD-10-CM | POA: Diagnosis not present

## 2020-09-28 DIAGNOSIS — Z833 Family history of diabetes mellitus: Secondary | ICD-10-CM | POA: Diagnosis not present

## 2020-09-28 DIAGNOSIS — Z7982 Long term (current) use of aspirin: Secondary | ICD-10-CM | POA: Insufficient documentation

## 2020-09-28 DIAGNOSIS — O26893 Other specified pregnancy related conditions, third trimester: Secondary | ICD-10-CM | POA: Diagnosis not present

## 2020-09-28 DIAGNOSIS — K6289 Other specified diseases of anus and rectum: Secondary | ICD-10-CM | POA: Insufficient documentation

## 2020-09-28 DIAGNOSIS — O10913 Unspecified pre-existing hypertension complicating pregnancy, third trimester: Secondary | ICD-10-CM | POA: Diagnosis not present

## 2020-09-28 DIAGNOSIS — K645 Perianal venous thrombosis: Secondary | ICD-10-CM

## 2020-09-28 DIAGNOSIS — Z3A30 30 weeks gestation of pregnancy: Secondary | ICD-10-CM | POA: Diagnosis not present

## 2020-09-28 DIAGNOSIS — Z888 Allergy status to other drugs, medicaments and biological substances status: Secondary | ICD-10-CM | POA: Insufficient documentation

## 2020-09-28 DIAGNOSIS — Z8719 Personal history of other diseases of the digestive system: Secondary | ICD-10-CM | POA: Insufficient documentation

## 2020-09-28 DIAGNOSIS — O2243 Hemorrhoids in pregnancy, third trimester: Secondary | ICD-10-CM | POA: Diagnosis not present

## 2020-09-28 MED ORDER — LIDOCAINE 4 % EX GEL: 1.0000 "application " | Freq: Three times a day (TID) | CUTANEOUS | 0 refills | Status: DC | PRN

## 2020-09-28 NOTE — MAU Note (Signed)
Pt reports + fetal movement and denies any OB complaints. Denies SROM, vaginal bleeding or bloody show.

## 2020-09-28 NOTE — MAU Note (Addendum)
Pt states two days ago she started having rectal pain, known Hemorid problem. Pt has tried lidocaine, sitz baths and creams. Nothing is working. 8/10 pain when she is sitting but 10/10 moving, coughing, laying. 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 No relief w/ interventions from home. States there is no bleeding but feels like it is blocking her rectum, no BM in last few days.

## 2020-09-28 NOTE — MAU Provider Note (Signed)
History     CSN: [id number]  Arrival date and time: 09/28/20 0103   Event Date/Time   First Provider Initiated Contact with Patient 09/28/20 0138      Chief Complaint  Patient presents with  . Rectal Pain   30 y.o. G1 @30 .5 wks presenting with rectal pain. Reports onset of pain after having BM 3 days ago. Pain became worse over the last 2 days. Denies recdtal bleeding. Reports hemorrhoids present and had a procedure years ago to get rid of them. She is using proctofoam, sitz baths, and OTC lidocaine. She is also taking Miralax daily and reports no pain or straining with last BM. Reports good FM. No pregnancy complaints.   OB History    Gravida  1   Para  0   Term  0   Preterm  0   AB  0   Living  0     SAB  0   IAB  0   Ectopic  0   Multiple  0   Live Births  0           Past Medical History:  Diagnosis Date  . Anal fissure   . History of chlamydia    in high school  . History of gonorrhea    in high school  . Hypertension   . Infertility, female   . Rectal prolapse     Past Surgical History:  Procedure Laterality Date  . MINOR HEMORRHOIDECTOMY  01/12/2013   I&D recurrent thrombosed hemorrhoid    Family History  Problem Relation Age of Onset  . Diabetes Mother   . Healthy Father     Social History   Tobacco Use  . Smoking status: Never Smoker  . Smokeless tobacco: Never Used  Vaping Use  . Vaping Use: Never used  Substance Use Topics  . Alcohol use: No  . Drug use: No    Allergies: No Known Allergies  Medications Prior to Admission  Medication Sig Dispense Refill Last Dose  . aspirin EC 81 MG tablet Take 1 tablet (81 mg total) by mouth daily. Swallow whole. 30 tablet 11 09/27/2020 at 0800  . folic acid (FOLVITE) 1 MG tablet Take 1 mg by mouth daily.   09/27/2020 at 2100  . labetalol (NORMODYNE) 200 MG tablet Take 1 tablet (200 mg total) by mouth 2 (two) times daily. 60 tablet 2 09/27/2020 at 2030  . metroNIDAZOLE (FLAGYL) 500 MG  tablet Take 1 tablet (500 mg total) by mouth 2 (two) times daily. 14 tablet 0 09/27/2020 at 2030  . polyethylene glycol (MIRALAX) 17 g packet Take 17 g by mouth 2 (two) times daily. 30 each 0 09/27/2020  . Prenatal 27-1 MG TABS Take 1 tablet by mouth daily.   09/27/2020  . diphenhydrAMINE (BENADRYL) 25 MG tablet Take 25 mg by mouth every 6 (six) hours as needed.     . hydrocortisone-pramoxine (PROCTOFOAM HC) rectal foam PLACE 1 APPLICATOR RECTALLY 3 (THREE) TIMES DAILY AS NEEDED FOR HEMORRHOIDS OR ANAL ITCHING.     09/29/2020 terconazole (TERAZOL 7) 0.4 % vaginal cream Place 1 applicator vaginally at bedtime for 7 days. 45 g 0     Review of Systems  Gastrointestinal: Positive for rectal pain. Negative for constipation and diarrhea.   Physical Exam   Blood pressure 130/82, pulse 74, temperature 98.1 F (36.7 C), temperature source Oral, resp. rate 18, height 5\' 6"  (1.676 m), weight 78.5 kg, last menstrual period 02/26/2020, SpO2 100 %.  Physical Exam  Vitals and nursing note reviewed. Exam conducted with a chaperone present.  Constitutional:      General: She is not in acute distress.    Appearance: Normal appearance.  HENT:     Head: Normocephalic and atraumatic.  Cardiovascular:     Rate and Rhythm: Normal rate.  Pulmonary:     Effort: Pulmonary effort is normal. No respiratory distress.  Genitourinary:    Rectum: External hemorrhoid present.    Musculoskeletal:        General: Normal range of motion.     Cervical back: Normal range of motion.  Skin:    General: Skin is warm and dry.  Neurological:     General: No focal deficit present.     Mental Status: She is alert and oriented to person, place, and time.  Psychiatric:        Mood and Affect: Mood normal.        Behavior: Behavior normal.   FHT: 144  No results found for this or any previous visit (from the past 24 hour(s)).  MAU Course  Procedures  MDM Partially thrombosed hemorrhoid present. Consult with Dr. Shawnie Pons, plan  for outpatient gen surg consult. Recommend cool packs, sitz baths, Rx Lidocaine gel, and proctofoam. Stable for discharge home.   Assessment and Plan   1. [redacted] weeks gestation of pregnancy   2. Thrombosed external hemorrhoid    Discharge home Follow up at [facility] as scheduled General surgery referral- message sent to clinical pool Rx Lidocaine 4% gel  Allergies as of 09/28/2020   No Known Allergies     Medication List    TAKE these medications   aspirin EC 81 MG tablet Take 1 tablet (81 mg total) by mouth daily. Swallow whole.   diphenhydrAMINE 25 MG tablet Commonly known as: BENADRYL Take 25 mg by mouth every 6 (six) hours as needed.   folic acid 1 MG tablet Commonly known as: FOLVITE Take 1 mg by mouth daily.   labetalol 200 MG tablet Commonly known as: NORMODYNE Take 1 tablet (200 mg total) by mouth 2 (two) times daily.   Lidocaine 4 % Gel Apply 1 application topically 3 (three) times daily as needed.   metroNIDAZOLE 500 MG tablet Commonly known as: FLAGYL Take 1 tablet (500 mg total) by mouth 2 (two) times daily.   polyethylene glycol 17 g packet Commonly known as: MiraLax Take 17 g by mouth 2 (two) times daily.   Prenatal 27-1 MG Tabs Take 1 tablet by mouth daily.   Proctofoam HC rectal foam Generic drug: hydrocortisone-pramoxine PLACE 1 APPLICATOR RECTALLY 3 (THREE) TIMES DAILY AS NEEDED FOR HEMORRHOIDS OR ANAL ITCHING.   terconazole 0.4 % vaginal cream Commonly known as: TERAZOL 7 Place 1 applicator vaginally at bedtime for 7 days.       Donette Larry, CNM 09/28/2020, 1:47 AM

## 2020-09-28 NOTE — Discharge Instructions (Signed)

## 2020-09-29 ENCOUNTER — Telehealth: Payer: Self-pay | Admitting: Lactation Services

## 2020-09-29 NOTE — Telephone Encounter (Signed)
-----   Message from Donette Larry, [place] sent at 09/28/2020  1:56 AM EDT ----- Regarding: referral Seen in MAU. Needs referral to general surgery for thrombosed hemorrhoid.

## 2020-09-29 NOTE — Telephone Encounter (Signed)
[facility] to inform of Referral for patient. Was transferred to Referral Coordinator, she did not answer. LM for Lequita Halt to call patient in regards to referral.

## 2020-09-30 ENCOUNTER — Telehealth: Payer: Self-pay

## 2020-10-01 ENCOUNTER — Encounter: Payer: Self-pay | Admitting: *Deleted

## 2020-10-01 ENCOUNTER — Ambulatory Visit: Payer: 59 | Attending: Obstetrics and Gynecology

## 2020-10-01 ENCOUNTER — Other Ambulatory Visit: Payer: Self-pay

## 2020-10-01 ENCOUNTER — Ambulatory Visit: Payer: 59 | Admitting: *Deleted

## 2020-10-01 DIAGNOSIS — O10013 Pre-existing essential hypertension complicating pregnancy, third trimester: Secondary | ICD-10-CM

## 2020-10-01 DIAGNOSIS — O358XX0 Maternal care for other (suspected) fetal abnormality and damage, not applicable or unspecified: Secondary | ICD-10-CM | POA: Diagnosis not present

## 2020-10-01 DIAGNOSIS — O365930 Maternal care for other known or suspected poor fetal growth, third trimester, not applicable or unspecified: Secondary | ICD-10-CM | POA: Insufficient documentation

## 2020-10-01 DIAGNOSIS — O10919 Unspecified pre-existing hypertension complicating pregnancy, unspecified trimester: Secondary | ICD-10-CM

## 2020-10-01 DIAGNOSIS — O3513X0 Maternal care for (suspected) chromosomal abnormality in fetus, trisomy 21, not applicable or unspecified: Secondary | ICD-10-CM

## 2020-10-01 DIAGNOSIS — O285 Abnormal chromosomal and genetic finding on antenatal screening of mother: Secondary | ICD-10-CM

## 2020-10-01 DIAGNOSIS — O351XX0 Maternal care for (suspected) chromosomal abnormality in fetus, not applicable or unspecified: Secondary | ICD-10-CM | POA: Diagnosis present

## 2020-10-01 DIAGNOSIS — O0990 Supervision of high risk pregnancy, unspecified, unspecified trimester: Secondary | ICD-10-CM | POA: Insufficient documentation

## 2020-10-01 DIAGNOSIS — Z3A37 37 weeks gestation of pregnancy: Secondary | ICD-10-CM

## 2020-10-01 DIAGNOSIS — Z862 Personal history of diseases of the blood and blood-forming organs and certain disorders involving the immune mechanism: Secondary | ICD-10-CM

## 2020-10-01 DIAGNOSIS — N979 Female infertility, unspecified: Secondary | ICD-10-CM | POA: Insufficient documentation

## 2020-10-01 NOTE — Telephone Encounter (Signed)
Called pt on 09/30/20. Pt reports severe pain to rectal area following procedure at [facility] yesterday. Pt reports following post-op instructions which includes regular sits baths and Tylenol every 6 hours. [facility] to follow up. Nursing staff agree to speak with PA in office for further recommendation after verifying pt has followed all post op suggestions. PA calls our office to consult with provider regarding medication safe to take during pregnancy. [place] [facility] available to speak with [facility] provider. Telephone call transferred.

## 2020-10-01 NOTE — Procedures (Signed)
Michelle Garrison January 07, 1991 [redacted]w[redacted]d  Fetus A Non-Stress Test Interpretation for 10/01/20  Indication: IUGR  Fetal Heart Rate A Mode: External Baseline Rate (A): 140 bpm Variability: Moderate Accelerations: 10 x 10 Decelerations: None Multiple birth?: No  Uterine Activity Mode: Palpation,Toco Contraction Frequency (min): none Resting Tone Palpated: Relaxed Resting Time: Adequate  Interpretation (Fetal Testing) Nonstress Test Interpretation: Reactive Overall Impression: Reassuring for gestational age Comments: Dr. Parke Poisson reviewed tracing

## 2020-10-02 ENCOUNTER — Encounter (HOSPITAL_COMMUNITY): Payer: Self-pay | Admitting: Obstetrics & Gynecology

## 2020-10-02 ENCOUNTER — Inpatient Hospital Stay (HOSPITAL_COMMUNITY)
Admission: AD | Admit: 2020-10-02 | Discharge: 2020-10-02 | Disposition: A | Discharge status: Home / Self Care | Payer: 59 | Attending: Obstetrics & Gynecology | Admitting: Obstetrics & Gynecology

## 2020-10-02 DIAGNOSIS — K625 Hemorrhage of anus and rectum: Secondary | ICD-10-CM | POA: Diagnosis present

## 2020-10-02 DIAGNOSIS — O0990 Supervision of high risk pregnancy, unspecified, unspecified trimester: Secondary | ICD-10-CM

## 2020-10-02 DIAGNOSIS — O26893 Other specified pregnancy related conditions, third trimester: Secondary | ICD-10-CM | POA: Diagnosis not present

## 2020-10-02 DIAGNOSIS — Z3689 Encounter for other specified antenatal screening: Secondary | ICD-10-CM

## 2020-10-02 DIAGNOSIS — N979 Female infertility, unspecified: Secondary | ICD-10-CM

## 2020-10-02 DIAGNOSIS — Z3A31 31 weeks gestation of pregnancy: Secondary | ICD-10-CM | POA: Diagnosis not present

## 2020-10-02 DIAGNOSIS — O10919 Unspecified pre-existing hypertension complicating pregnancy, unspecified trimester: Secondary | ICD-10-CM

## 2020-10-02 DIAGNOSIS — O285 Abnormal chromosomal and genetic finding on antenatal screening of mother: Secondary | ICD-10-CM

## 2020-10-02 NOTE — MAU Provider Note (Signed)
Event Date/Time   First Provider Initiated Contact with Patient 10/02/20 0123      S [name] is a 30 y.o. G1P0000 patient who presents to MAU today with complaint of rectal pain and bleeding. Patient states she had removal of a thrombosed hemorrhoid two days ago.  She reports she passed a hard stool yesterday and started experiencing pain.  She states the bleeding started after.  She reports pain a 10/10 that was unrelieved with tylenol.  Patient denies abdominal cramping or contractions, while endorsing fetal movement.    O BP (!) 134/94   Pulse 85   Resp 18   LMP 02/26/2020  Physical Exam Constitutional:      Appearance: Normal appearance.  HENT:     Head: Normocephalic and atraumatic.  Eyes:     Conjunctiva/sclera: Conjunctivae normal.  Cardiovascular:     Rate and Rhythm: Normal rate.  Pulmonary:     Effort: Pulmonary effort is normal. No respiratory distress.  Musculoskeletal:     Cervical back: Normal range of motion.  Skin:    General: Skin is warm and dry.  Neurological:     Mental Status: She is alert and oriented to person, place, and time.  Psychiatric:        Mood and Affect: Mood normal.        Behavior: Behavior normal.        Thought Content: Thought content normal.    135 bpm, Mod Var, -Decels, +10x10Accels No ctx graphed  A Medical screening exam complete Rectal Bleeding NST Reactive for GA  P -Discussed transfer to [facility] for further evaluation. -Patient agreeable. -Informed that will obtain NST prior to transfer. -Patient SO questions if she will receive pain medication and informed that if she does it will be in ED. -No other questions or concerns. -[facility] contacted and accepts patient for transfer.  -Patient may return to MAU, as needed, for any pregnancy related complaints/concerns.  Gerrit Heck, CNM 10/02/2020 1:49 AM   Reassessment (1:56 AM) -Patient states she does not desire to be transferred currently, but will go home and  return in the morning for ED visit.  -Discharge order placed.  Cherre Robins MSN, CNM Advanced Practice Provider, Center for [employer]

## 2020-10-02 NOTE — MAU Note (Signed)
Pt had rectal surgery on Tuesday. Started bleeding and started having lots of rectal pain .

## 2020-10-05 NOTE — Progress Notes (Signed)
Screened 09/22/20 

## 2020-10-07 ENCOUNTER — Other Ambulatory Visit: Payer: Self-pay

## 2020-10-07 ENCOUNTER — Ambulatory Visit (INDEPENDENT_AMBULATORY_CARE_PROVIDER_SITE_OTHER): Payer: 59 | Admitting: Obstetrics and Gynecology

## 2020-10-07 VITALS — BP 115/70 | HR 83 | Wt 169.5 lb

## 2020-10-07 DIAGNOSIS — O285 Abnormal chromosomal and genetic finding on antenatal screening of mother: Secondary | ICD-10-CM

## 2020-10-07 DIAGNOSIS — N871 Moderate cervical dysplasia: Secondary | ICD-10-CM

## 2020-10-07 DIAGNOSIS — O10919 Unspecified pre-existing hypertension complicating pregnancy, unspecified trimester: Secondary | ICD-10-CM

## 2020-10-07 DIAGNOSIS — Z3A32 32 weeks gestation of pregnancy: Secondary | ICD-10-CM

## 2020-10-07 DIAGNOSIS — O365930 Maternal care for other known or suspected poor fetal growth, third trimester, not applicable or unspecified: Secondary | ICD-10-CM

## 2020-10-07 DIAGNOSIS — D573 Sickle-cell trait: Secondary | ICD-10-CM

## 2020-10-07 DIAGNOSIS — O0990 Supervision of high risk pregnancy, unspecified, unspecified trimester: Secondary | ICD-10-CM

## 2020-10-07 DIAGNOSIS — O2243 Hemorrhoids in pregnancy, third trimester: Secondary | ICD-10-CM | POA: Insufficient documentation

## 2020-10-07 DIAGNOSIS — O358XX0 Maternal care for other (suspected) fetal abnormality and damage, not applicable or unspecified: Secondary | ICD-10-CM

## 2020-10-07 DIAGNOSIS — O35BXX0 Maternal care for other (suspected) fetal abnormality and damage, fetal cardiac anomalies, not applicable or unspecified: Secondary | ICD-10-CM

## 2020-10-07 NOTE — Progress Notes (Signed)
PRENATAL VISIT NOTE  Subjective:  Michelle Garrison is a 30 y.o. G1P0000 at [redacted]w[redacted]d being seen today for ongoing prenatal care.  She is currently monitored for the following issues for this high-risk pregnancy and has Supervision of high risk pregnancy, antepartum; Chronic hypertension during pregnancy; Infertility, female; History of trichomoniasis; Dysplasia of cervix, high grade CIN 2; Constipation, chronic; Hemorrhoids, external, thrombosed; Anal fissure; Abnormal chromosomal and genetic finding on antenatal screening mother; Sickle cell trait (HCC); Intrauterine growth restriction, antepartum, third trimester, not applicable or unspecified fetus; Abnormal fetal echocardiogram affecting antepartum care of mother; and Hemorrhoids during pregnancy in third trimester on their problem list.  Patient reports  hemorrhoidal pain . Patient states she had [place] surgery on 6/6 and 5/31 and she states they lanced it but she's still having discomfort. She states they said there's nothing they can do while she's pregnant. She is using proctofoam  Contractions: Not present. Vag. Bleeding: None.  Movement: Present. Denies leaking of fluid.   The following portions of the patient's history were reviewed and updated as appropriate: allergies, current medications, past family history, past medical history, past social history, past surgical history and problem list.   Objective:   Vitals:   10/07/20 1544  BP: 115/70  Pulse: 83  Weight: 169 lb 8 oz (76.9 kg)    Fetal Status: Fetal Heart Rate (bpm): 143   Movement: Present     General:  Alert, oriented and cooperative. Patient is in no acute distress.  Skin: Skin is warm and dry. No rash noted.   Cardiovascular: Normal heart rate noted  Respiratory: Normal respiratory effort, no problems with respiration noted  Abdomen: Soft, gravid, appropriate for gestational age.  Pain/Pressure: Present     Pelvic: Cervical exam deferred         Extremities: Normal range of motion.  Edema: None  Mental Status: Normal mood and affect. Normal behavior. Normal judgment and thought content.   EGBUS normal, no inguinal LAD 3cm x3-4cm and anteriorly bigger than posteriorly hemorrhoid. No obvious signs of thrombosis, no erythema, no s/s of infection  Assessment and Plan:  Pregnancy: G1P0000 at [redacted]w[redacted]d 1. Supervision of high risk pregnancy, antepartum Already GBS positive Continue low dose ASA  2. Dysplasia of cervix, high grade CIN 2 Needs colpo PP  3. Chronic hypertension during pregnancy Doing well on labetalol  4. [redacted] weeks gestation of pregnancy  5. Abnormal chromosomal and genetic finding on antenatal screening mother High risk for T21, declined amnio. Followed by [facility]. See below  6. Intrauterine growth restriction, antepartum, third trimester, not applicable or unspecified fetus Followed with qwk BPPs and dopplers with MFM. Next one on Friday. Per MFM, delivery at 37wks at the latest. I told her this will depend on how she and baby does.  In terms of delivery mode, I told her that a trial of labor is reasonable and can see how baby tolerates it but high chance baby may not be able to and she needs a c-section. She is wary of a vaginal delivery due to her hemorrhoids which is very reasonable. I told her we can talk about this more at her nv 6/2: 14, cephalic, 10/10, elevated UA dopplers 5/26: 1.9%, 1167gm, ac 10%  7. Sickle cell trait (HCC)  8. Abnormal fetal echocardiography affecting antepartum care of mother, single or unspecified fetus Has rpt echo tomorrow. 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   9. Hemorrhoids during pregnancy in third trimester I told her to  call [facility] surgery back and see if there's anything else that can be done. I told her that I don't believe she needs to go to the hospital based on her exam today  Preterm labor symptoms and general obstetric precautions including but not limited to  vaginal bleeding, contractions, leaking of fluid and fetal movement were reviewed in detail with the patient. Please refer to After Visit Summary for other counseling recommendations.   Return if symptoms worsen or fail to improve.  Future Appointments  Date Time Provider Department Center  10/09/2020  1:45 PM [facility] NURSE [facility]  10/09/2020  2:00 PM [facility] US1 [facility]  10/09/2020  3:15 PM [facility] NST [facility]  10/26/2020  8:15 AM Reva Bores, MD [facility]    [place], MD

## 2020-10-09 ENCOUNTER — Other Ambulatory Visit: Payer: Self-pay

## 2020-10-09 ENCOUNTER — Ambulatory Visit: Payer: 59 | Admitting: *Deleted

## 2020-10-09 ENCOUNTER — Ambulatory Visit: Payer: 59 | Attending: Obstetrics and Gynecology

## 2020-10-09 VITALS — BP 125/74 | HR 81

## 2020-10-09 DIAGNOSIS — N979 Female infertility, unspecified: Secondary | ICD-10-CM

## 2020-10-09 DIAGNOSIS — O10919 Unspecified pre-existing hypertension complicating pregnancy, unspecified trimester: Secondary | ICD-10-CM

## 2020-10-09 DIAGNOSIS — Z862 Personal history of diseases of the blood and blood-forming organs and certain disorders involving the immune mechanism: Secondary | ICD-10-CM

## 2020-10-09 DIAGNOSIS — Z3A32 32 weeks gestation of pregnancy: Secondary | ICD-10-CM

## 2020-10-09 DIAGNOSIS — O0990 Supervision of high risk pregnancy, unspecified, unspecified trimester: Secondary | ICD-10-CM

## 2020-10-09 DIAGNOSIS — O285 Abnormal chromosomal and genetic finding on antenatal screening of mother: Secondary | ICD-10-CM

## 2020-10-09 DIAGNOSIS — O365930 Maternal care for other known or suspected poor fetal growth, third trimester, not applicable or unspecified: Secondary | ICD-10-CM | POA: Diagnosis present

## 2020-10-09 DIAGNOSIS — O10013 Pre-existing essential hypertension complicating pregnancy, third trimester: Secondary | ICD-10-CM | POA: Diagnosis not present

## 2020-10-09 DIAGNOSIS — Z362 Encounter for other antenatal screening follow-up: Secondary | ICD-10-CM

## 2020-10-09 DIAGNOSIS — O358XX0 Maternal care for other (suspected) fetal abnormality and damage, not applicable or unspecified: Secondary | ICD-10-CM | POA: Diagnosis not present

## 2020-10-09 DIAGNOSIS — O351XX0 Maternal care for (suspected) chromosomal abnormality in fetus, not applicable or unspecified: Secondary | ICD-10-CM | POA: Diagnosis not present

## 2020-10-09 DIAGNOSIS — O3513X0 Maternal care for (suspected) chromosomal abnormality in fetus, trisomy 21, not applicable or unspecified: Secondary | ICD-10-CM

## 2020-10-09 NOTE — Procedures (Signed)
Samie Barclift January 09, 1991 [redacted]w[redacted]d  Fetus A Non-Stress Test Interpretation for 10/09/20  Indication: IUGR  Fetal Heart Rate A Mode: External Baseline Rate (A): 140 bpm Variability: Moderate Accelerations: None Decelerations: None Multiple birth?: No  Uterine Activity Mode: Palpation, Toco Contraction Frequency (min): none Resting Tone Palpated: Relaxed Resting Time: Adequate  Interpretation (Fetal Testing) Nonstress Test Interpretation: Non-reactive Overall Impression: Reassuring for gestational age Comments: Dr. Judeth Cornfield reviewed tracing.

## 2020-10-12 ENCOUNTER — Other Ambulatory Visit: Payer: Self-pay | Admitting: *Deleted

## 2020-10-12 ENCOUNTER — Telehealth: Payer: Self-pay | Admitting: Lactation Services

## 2020-10-12 DIAGNOSIS — O365990 Maternal care for other known or suspected poor fetal growth, unspecified trimester, not applicable or unspecified: Secondary | ICD-10-CM

## 2020-10-12 NOTE — Telephone Encounter (Signed)
Called patient in regards to Lactation questions at request of Brett Albino, Prenatal Navigator.   Patient did not answer. LM for her to call the office at her convenience.   Will attempt again at a later time.

## 2020-10-14 NOTE — Telephone Encounter (Signed)
Called patient to answer questions in regards to Lactation.   Patient voiced she would like to know the basics of breast feeding. Patient voiced that she plans to breast feed. She voiced some of the benefits of breast feeding that she is aware of.   Reviewed rooming in, latching early and often, supply and demand, skin to skin, hand expression, when supplementation may be needed. Reviewed IP/OP Lactation services and asking for assistance as needed.    Mom asked about pumping and storing milk, reviewed it should not be done until it is determined if infant is getting enough and gaining well.   Enc mom to take BF class and discussed [email or web address] classes. She has a BF class set up for [facility] in a few weeks. She as book on breast feeding from the [facility].   Reviewed going on [email or web address] or [email or web address] to see if she qualifies for a brast pump. Reviewed she will get a manual pump in the hospital, and may be able to get a pump through [facility] is needed or depending on what package mom has through [facility].   Patient to call with any other questions or concerns as needed.

## 2020-10-16 ENCOUNTER — Encounter (HOSPITAL_COMMUNITY): Payer: Self-pay | Admitting: Obstetrics and Gynecology

## 2020-10-16 ENCOUNTER — Ambulatory Visit: Payer: 59 | Admitting: *Deleted

## 2020-10-16 ENCOUNTER — Inpatient Hospital Stay (HOSPITAL_COMMUNITY)
Admission: AD | Admit: 2020-10-16 | Discharge: 2020-10-16 | Disposition: A | Discharge status: Other Acute Inpatient Hospital | Payer: 59 | Attending: Obstetrics and Gynecology | Admitting: Obstetrics and Gynecology

## 2020-10-16 ENCOUNTER — Other Ambulatory Visit: Payer: Self-pay

## 2020-10-16 ENCOUNTER — Ambulatory Visit (HOSPITAL_BASED_OUTPATIENT_CLINIC_OR_DEPARTMENT_OTHER): Payer: 59 | Admitting: Obstetrics

## 2020-10-16 ENCOUNTER — Encounter: Payer: Self-pay | Admitting: *Deleted

## 2020-10-16 ENCOUNTER — Ambulatory Visit (HOSPITAL_BASED_OUTPATIENT_CLINIC_OR_DEPARTMENT_OTHER): Payer: 59

## 2020-10-16 VITALS — BP 127/82 | HR 77

## 2020-10-16 DIAGNOSIS — O365931 Maternal care for other known or suspected poor fetal growth, third trimester, fetus 1: Secondary | ICD-10-CM

## 2020-10-16 DIAGNOSIS — O10919 Unspecified pre-existing hypertension complicating pregnancy, unspecified trimester: Secondary | ICD-10-CM | POA: Insufficient documentation

## 2020-10-16 DIAGNOSIS — O368330 Maternal care for abnormalities of the fetal heart rate or rhythm, third trimester, not applicable or unspecified: Secondary | ICD-10-CM

## 2020-10-16 DIAGNOSIS — O365930 Maternal care for other known or suspected poor fetal growth, third trimester, not applicable or unspecified: Secondary | ICD-10-CM

## 2020-10-16 DIAGNOSIS — O99820 Streptococcus B carrier state complicating pregnancy: Secondary | ICD-10-CM | POA: Diagnosis not present

## 2020-10-16 DIAGNOSIS — I10 Essential (primary) hypertension: Secondary | ICD-10-CM

## 2020-10-16 DIAGNOSIS — Z862 Personal history of diseases of the blood and blood-forming organs and certain disorders involving the immune mechanism: Secondary | ICD-10-CM

## 2020-10-16 DIAGNOSIS — O163 Unspecified maternal hypertension, third trimester: Secondary | ICD-10-CM | POA: Insufficient documentation

## 2020-10-16 DIAGNOSIS — D573 Sickle-cell trait: Secondary | ICD-10-CM | POA: Diagnosis not present

## 2020-10-16 DIAGNOSIS — Z3A33 33 weeks gestation of pregnancy: Secondary | ICD-10-CM

## 2020-10-16 DIAGNOSIS — Z7982 Long term (current) use of aspirin: Secondary | ICD-10-CM | POA: Diagnosis not present

## 2020-10-16 DIAGNOSIS — O0990 Supervision of high risk pregnancy, unspecified, unspecified trimester: Secondary | ICD-10-CM

## 2020-10-16 DIAGNOSIS — N979 Female infertility, unspecified: Secondary | ICD-10-CM | POA: Insufficient documentation

## 2020-10-16 DIAGNOSIS — Z20822 Contact with and (suspected) exposure to covid-19: Secondary | ICD-10-CM | POA: Diagnosis not present

## 2020-10-16 DIAGNOSIS — Z79899 Other long term (current) drug therapy: Secondary | ICD-10-CM | POA: Insufficient documentation

## 2020-10-16 DIAGNOSIS — Z362 Encounter for other antenatal screening follow-up: Secondary | ICD-10-CM

## 2020-10-16 DIAGNOSIS — O2243 Hemorrhoids in pregnancy, third trimester: Secondary | ICD-10-CM | POA: Insufficient documentation

## 2020-10-16 DIAGNOSIS — O358XX0 Maternal care for other (suspected) fetal abnormality and damage, not applicable or unspecified: Secondary | ICD-10-CM

## 2020-10-16 DIAGNOSIS — O351XX0 Maternal care for (suspected) chromosomal abnormality in fetus, not applicable or unspecified: Secondary | ICD-10-CM

## 2020-10-16 DIAGNOSIS — O365990 Maternal care for other known or suspected poor fetal growth, unspecified trimester, not applicable or unspecified: Secondary | ICD-10-CM | POA: Insufficient documentation

## 2020-10-16 DIAGNOSIS — O35BXX0 Maternal care for other (suspected) fetal abnormality and damage, fetal cardiac anomalies, not applicable or unspecified: Secondary | ICD-10-CM

## 2020-10-16 DIAGNOSIS — O285 Abnormal chromosomal and genetic finding on antenatal screening of mother: Secondary | ICD-10-CM | POA: Insufficient documentation

## 2020-10-16 DIAGNOSIS — O10013 Pre-existing essential hypertension complicating pregnancy, third trimester: Secondary | ICD-10-CM

## 2020-10-16 DIAGNOSIS — O10913 Unspecified pre-existing hypertension complicating pregnancy, third trimester: Secondary | ICD-10-CM | POA: Diagnosis not present

## 2020-10-16 LAB — URINALYSIS, ROUTINE W REFLEX MICROSCOPIC
Bilirubin Urine: NEGATIVE
Glucose, UA: NEGATIVE mg/dL
Hgb urine dipstick: NEGATIVE
Ketones, ur: NEGATIVE mg/dL
Nitrite: NEGATIVE
Protein, ur: NEGATIVE mg/dL
Renal Epithelial: 3
Specific Gravity, Urine: 1.011 (ref 1.005–1.030)
pH: 5 (ref 5.0–8.0)

## 2020-10-16 LAB — CBC
HCT: 34.9 % — ABNORMAL LOW (ref 36.0–46.0)
Hemoglobin: 11.9 g/dL — ABNORMAL LOW (ref 12.0–15.0)
MCH: 32.8 pg (ref 26.0–34.0)
MCHC: 34.1 g/dL (ref 30.0–36.0)
MCV: 96.1 fL (ref 80.0–100.0)
Platelets: 357 10*3/uL (ref 150–400)
RBC: 3.63 MIL/uL — ABNORMAL LOW (ref 3.87–5.11)
RDW: 13.9 % (ref 11.5–15.5)
WBC: 7.2 10*3/uL (ref 4.0–10.5)
nRBC: 0 % (ref 0.0–0.2)

## 2020-10-16 LAB — RESP PANEL BY RT-PCR (FLU A&B, COVID) ARPGX2
Influenza A by PCR: NEGATIVE
Influenza B by PCR: NEGATIVE
SARS Coronavirus 2 by RT PCR: NEGATIVE

## 2020-10-16 LAB — TYPE AND SCREEN
ABO/RH(D): B POS
Antibody Screen: NEGATIVE

## 2020-10-16 LAB — RPR: RPR Ser Ql: NONREACTIVE

## 2020-10-16 MED ORDER — LACTATED RINGERS IV SOLN: INTRAVENOUS | Status: DC

## 2020-10-16 MED ORDER — BETAMETHASONE SOD PHOS & ACET 6 (3-3) MG/ML IJ SUSP
12.0000 mg | INTRAMUSCULAR | Status: DC
  Administered 2020-10-16: 12 mg via INTRAMUSCULAR
  Filled 2020-10-16: qty 5

## 2020-10-16 MED ORDER — LACTATED RINGERS IV BOLUS
1000.0000 mL | Freq: Once | INTRAVENOUS | Status: AC
  Administered 2020-10-16: 1000 mL via INTRAVENOUS

## 2020-10-16 MED ORDER — LABETALOL HCL 100 MG PO TABS
200.0000 mg | ORAL_TABLET | Freq: Two times a day (BID) | ORAL | Status: DC
  Administered 2020-10-16: 200 mg via ORAL
  Filled 2020-10-16: qty 2

## 2020-10-16 NOTE — MAU Note (Signed)
OB Note [facility] NICU is full and they can't accept transfers. Will try [facility]  Cornelia Copa MD Attending Center for [employer] (Faculty Practice) 10/16/2020 Time: 1110am

## 2020-10-16 NOTE — Procedures (Signed)
Michelle Garrison 10-05-1990 [redacted]w[redacted]d  Fetus A Non-Stress Test Interpretation for 10/16/20  Indication: IUGR  Fetal Heart Rate A Mode: External Baseline Rate (A): 140 bpm Variability: Minimal, Moderate Accelerations: None Decelerations: Variable Multiple birth?: No  Uterine Activity Mode: Palpation, Toco Contraction Frequency (min): none Resting Tone Palpated: Relaxed Resting Time: Adequate  Interpretation (Fetal Testing) Nonstress Test Interpretation: Non-reactive Overall Impression: Non-reassuring Comments: Dr. Parke Poisson reviewed tracing. Pt sent to [facility] for admission for extended fetal monitoring and steroids.

## 2020-10-16 NOTE — Progress Notes (Signed)
OB Note [facility] Dr. Darra Lis is able to accept  Fetus category I with an accel with repositioning and IVF bolus  Cornelia Copa MD Attending Center for [facility] (Faculty Practice) 10/16/2020 Time: 1145am

## 2020-10-16 NOTE — MAU Note (Signed)
Odis Luster with [facility] Transport notified and transport requested. Richard stated transport would arrive in 45 minutes.

## 2020-10-16 NOTE — MAU Note (Signed)
Michelle Garrison is a 30 y.o. at [redacted]w[redacted]d here in MAU reporting: was sent over from [facility] for admission. States the baby's heart beat was dropping. No bleeding, pain, or LOF. Reports +FM today.  Onset of complaint: today  Pain score: 0/10  Vitals:   10/16/20 1012  BP: 131/90  Pulse: 75  Resp: 16  Temp: 98.8 F (37.1 C)  SpO2: 99%     FHT:146  Lab orders placed from triage: UA

## 2020-10-16 NOTE — MAU Note (Signed)
Spoke with Gloris Manchester, a Neurosurgeon who provided RN with unit fax number to fax patient demographics sheet. Traci transferred this RN to [facility] Triage nurse and got disconnected.

## 2020-10-16 NOTE — MAU Provider Note (Signed)
Chief Complaint:  Monitoring   HPI: Eda Magnussen is a 30 y.o. G1P0000 at [redacted]w[redacted]d by L/5 who was sent to maternity admissions by MFM (Dr. 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 Pt reports that she has been feeling well at home. No specific concerns now or before MFM appointment today. She reports good adherence to home medications including prenatal vitamin, ASA, and labetalol  BID (last dose in PM on 6/16). She reports good fetal movement, denies LOF, vaginal bleeding, vaginal itching/burning, urinary symptoms, h/a, dizziness, n/v, or fever/chills.   Past Medical History: Past Medical History:  Diagnosis Date   Anal fissure    History of chlamydia    in high school   History of gonorrhea    in high school   Hypertension    Infertility, female    Rectal prolapse     Past obstetric history: OB History  Gravida Para Term Preterm AB Living  1 0 0 0 0 0  SAB IAB Ectopic Multiple Live Births  0 0 0 0 0    # Outcome Date GA Lbr Len/2nd Weight Sex Delivery Anes PTL Lv  1 Current             Past Surgical History: Past Surgical History:  Procedure Laterality Date   MINOR HEMORRHOIDECTOMY  01/12/2013   I&D recurrent thrombosed hemorrhoid    Family History: Family History  Problem Relation Age of Onset   Diabetes Mother    Healthy Father     Social History: Social History   Tobacco Use   Smoking status: Never   Smokeless tobacco: Never  Vaping Use   Vaping Use: Never used  Substance Use Topics   Alcohol use: No   Drug use: No    Allergies: No Known Allergies  Meds:  Medications Prior to Admission  Medication Sig Dispense Refill Last Dose   aspirin EC 81 MG tablet Take 1 tablet (81 mg total) by mouth daily. Swallow whole. 30 tablet 11    diphenhydrAMINE (BENADRYL) 25 MG tablet Take 25 mg by mouth every 6 (six) hours as needed.       folic acid (FOLVITE) 1 MG tablet Take 1 mg by mouth daily.      hydrocortisone-pramoxine (PROCTOFOAM HC) rectal foam PLACE 1 APPLICATOR RECTALLY 3 (THREE) TIMES DAILY AS NEEDED FOR HEMORRHOIDS OR ANAL ITCHING. (Patient not taking: Reported on 10/07/2020)      labetalol (NORMODYNE) 200 MG tablet Take 1 tablet (200 mg total) by mouth 2 (two) times daily. 60 tablet 2    Lidocaine 4 % GEL Apply 1 application topically 3 (three) times daily as needed. 30 g 0    Prenatal 27-1 MG TABS Take 1 tablet by mouth daily.      psyllium (METAMUCIL) 58.6 % powder Take 1 packet by mouth 3 (three) times daily.       ROS:  Review of Systems  Constitutional:  Negative for chills and fever.  HENT:  Negative for congestion and sore throat.   Eyes:  Negative for photophobia and visual disturbance.  Respiratory:  Negative for cough and shortness of breath.   Cardiovascular:  Negative for chest pain and leg swelling.  Gastrointestinal:  Negative for abdominal pain, constipation, diarrhea, nausea and vomiting.  Genitourinary:  Negative for dysuria, vaginal bleeding, vaginal discharge and vaginal pain.  Musculoskeletal:  Negative for myalgias.  Neurological:  Negative  for headaches.  Psychiatric/Behavioral:  Negative for suicidal ideas.    I have reviewed patient's Past Medical Hx, Surgical Hx, Family Hx, Social Hx, medications and allergies.   Physical Exam  Patient Vitals for the past 24 hrs:  BP Temp Temp src Pulse Resp SpO2 Height Weight  10/16/20 1116 117/65 -- -- 66 -- -- -- --  10/16/20 1101 120/79 -- -- 69 -- 99 % -- --  10/16/20 1056 (!) 141/85 -- -- 69 -- -- -- --  10/16/20 1031 (!) 133/93 -- -- 75 -- -- -- --  10/16/20 1012 131/90 98.8 F (37.1 C) Oral 75 16 99 % -- --  10/16/20 1008 -- -- -- -- -- -- 5\' 6"  (1.676 m) 76.6 kg   Constitutional: Well-developed, well-nourished female in no acute distress. Sitting comfortably on stretcher. Cardiovascular: normal rate Respiratory: normal effort GI: Abd  soft, non-tender, gravid appropriate for gestational age.  MS: Extremities nontender, no edema, normal ROM Neurologic: Alert and oriented x 4.  GU: deferred    FHT:  Baseline 135, moderate variability, accelerations present, intermittent variable decels Contractions: none   Labs: Results for orders placed or performed during the hospital encounter of 10/16/20 (from the past 24 hour(s))  Type and screen [facility]     Status: None   Collection Time: 10/16/20 10:30 AM  Result Value Ref Range   ABO/RH(D) B POS    Antibody Screen NEG    Sample Expiration      10/19/2020,2359 Performed at [facility], [address], [place], [place] [address]   CBC     Status: Abnormal   Collection Time: 10/16/20 10:30 AM  Result Value Ref Range   WBC 7.2 4.0 - 10.5 K/uL   RBC 3.63 (L) 3.87 - 5.11 MIL/uL   Hemoglobin 11.9 (L) 12.0 - 15.0 g/dL   HCT 60.4 (L) 54.0 - 98.1 %   MCV 96.1 80.0 - 100.0 fL   MCH 32.8 26.0 - 34.0 pg   MCHC 34.1 30.0 - 36.0 g/dL   RDW 19.1 47.8 - 29.5 %   Platelets 357 150 - 400 K/uL   nRBC 0.0 0.0 - 0.2 %  Urinalysis, Routine w reflex microscopic Nasopharyngeal Swab     Status: Abnormal   Collection Time: 10/16/20 10:35 AM  Result Value Ref Range   Color, Urine YELLOW YELLOW   APPearance HAZY (A) CLEAR   Specific Gravity, Urine 1.011 1.005 - 1.030   pH 5.0 5.0 - 8.0   Glucose, UA NEGATIVE NEGATIVE mg/dL   Hgb urine dipstick NEGATIVE NEGATIVE   Bilirubin Urine NEGATIVE NEGATIVE   Ketones, ur NEGATIVE NEGATIVE mg/dL   Protein, ur NEGATIVE NEGATIVE mg/dL   Nitrite NEGATIVE NEGATIVE   Leukocytes,Ua LARGE (A) NEGATIVE   RBC / HPF 6-10 0 - 5 RBC/hpf   WBC, UA 6-10 0 - 5 WBC/hpf   Bacteria, UA MANY (A) NONE SEEN   Squamous Epithelial / LPF 6-10 0 - 5   Renal Epithelial 3    Budding Yeast PRESENT    --/--/B POS (06/17 1030)  Imaging:  [place] MFM FETAL BPP W/NONSTRESS  Result Date:  10/16/2020 ----------------------------------------------------------------------  OBSTETRICS REPORT                       (Signed Final 10/16/2020 10:46 am) ---------------------------------------------------------------------- Patient Info  ID #:       [id number]  D.O.B.:  [birth year] (29 yrs)  Name:       HISAKO BUGH                Visit Date: 10/16/2020 08:17 am ---------------------------------------------------------------------- Performed By  Attending:        Ma Rings MD         Secondary Phy.:   [facility]                                                             [facility]  Performed By:     Jeanmarie Plant       Address:          [address]                    [place]                                                             [place], [place]                                                             [address]  Referred By:      Jerene Bears          Location[facility] ---------------------------------------------------------------------- Orders  #  Description                           Code        Ordered By  1  [place] MFM OB FOLLOW UP                   [address].09    RAVI SHANKAR  2  [place] MFM UA CORD DOPPLER  [address].60    RAVI SHANKAR  3  [place] MFM FETAL BPP                      45[address].8     [facility]     W/NONSTRESS ----------------------------------------------------------------------  #  Order #                     Accession #                Episode #  1  [id number]                   [id number]                 [id number]  2  [id number]                   [id number]                 [id number]  3  [id number]                   [id number]                 [id number] ---------------------------------------------------------------------- Indications   Maternal care for known or suspected poor      O36.5930  fetal growth, third trimester, not applicable or  unspecified IUGR  [redacted] weeks gestation of pregnancy                Z3A.33  Fetal Trisomy 21 affecting care of mother,     O35.1XX0  antepartum (per NIPS)  Fetal cardiac anomaly affecting pregnancy,     O35.8XX0  antepartum (known AV canal)  Hypertension - Chronic/Pre-                    O10.019  existing(Labetalol)  History of sickle cell trait                   Z86.2  Encounter for other antenatal screening        Z36.2  follow-up ---------------------------------------------------------------------- Fetal Evaluation  Num Of Fetuses:         1  Preg. Location:         Intrauterine  Fetal Heart Rate(bpm):  136  Cardiac Activity:       Observed  Presentation:           Cephalic  Placenta:               Posterior  P. Cord Insertion:      Previously Visualized  Amniotic Fluid  AFI FV:      Within normal limits  AFI Sum(cm)     %Tile       Largest Pocket(cm)  10.55           22          3.7  RUQ(cm)       RLQ(cm)       LUQ(cm)        LLQ(cm)  3.7           2.15          1.78           2.92 ---------------------------------------------------------------------- Biophysical Evaluation  Amniotic F.V:   Within normal limits       F. Tone:        Observed  F. Movement:    Observed  N.S.T:          Nonreactive  F. Breathing:   Not Observed               Score:          6/10 ---------------------------------------------------------------------- Biometry  BPD:      75.2  mm     G. Age:  30w 1d        < 1  %    CI:        75.37   %    70 - 86                                                          FL/HC:      20.5   %    19.9 - 21.5  HC:      274.7  mm     G. Age:  30w 0d        < 1  %    HC/AC:      1.07        0.96 - 1.11  AC:      257.2  mm     G. Age:  29w 6d        < 1  %    FL/BPD:     74.9   %    71 - 87  FL:       56.3  mm     G. Age:  29w 4d        < 1  %    FL/AC:      21.9   %    20 - 24  HUM:       46.8  mm     G. Age:  27w 4d        < 5  %  LV:       10.3  mm  Est. FW:    1461  gm      3 lb 4 oz    < 1  % ---------------------------------------------------------------------- OB History  Gravidity:    1         Term:   0        Prem:   0        SAB:   0  TOP:          0       Ectopic:  0        Living: 0 ---------------------------------------------------------------------- Gestational Age  LMP:           33w 2d        Date:  02/26/20                 EDD:   12/02/20  U/S Today:     29w 6d                                        EDD:   12/26/20  Best:          33w 2d     Det. By:  LMP  (02/26/20)          EDD:   12/02/20 ---------------------------------------------------------------------- Anatomy  Cranium:  Brachycephaly          Aortic Arch:            Previously seen  Cavum:                 Appears normal         Ductal Arch:            Previously seen  Ventricles:            Ventriculomegaly       Diaphragm:              Previously seen                         10mm  Choroid Plexus:        Previously seen        Stomach:                Appears normal, left                                                                        sided  Cerebellum:            Previously seen        Abdomen:                Previously seen  Posterior Fossa:       Previously seen        Abdominal Wall:         Previously seen  Nuchal Fold:           Previously seen        Cord Vessels:           Previously seen  Face:                  previously seen        Kidneys:                Appear normal  Lips:                  Previously seen        Bladder:                Appears normal  Thoracic:              Previously seen        Spine:                  Previously seen  Heart:                 Previously seen        Upper Extremities:      Previously seen  RVOT:                  Previously seen        Lower Extremities:      Previously seen  LVOT:                  Previously seen  Other:  Heels and Open hands previously  visualized. Technically difficult due          to advanced GA  and fetal position. ---------------------------------------------------------------------- Doppler - Fetal Vessels  Umbilical Artery   S/D     %tile      RI    %tile      PI    %tile            ADFV    RDFV   5.18   > 97.5    0.81   > 97.5    1.63   > 97.5               No      No ---------------------------------------------------------------------- Cervix Uterus Adnexa  Cervix  Not visualized (advanced GA >24wks)  Uterus  Appears normal  Right Ovary  Not visualized  Left Ovary  Not visualized  Cul De Sac  No free fluid seen.  Adnexa  Appears normal ---------------------------------------------------------------------- Comments  Jerene Pitch was seen for a follow up growth scan due to  fetal growth restriction noted during her prior ultrasound  exams.  Her pregnancy has been complicated by a positive  cell free DNA test showing an increased risk of Down  syndrome.  The fetus a complete balanced AV canal defect  with a left-sided superior vena cava.  The pediatric  cardiologist [facility]) has stated that the patient may deliver  here at [facility].  She denies any problems since her last  exam and reports feeling vigorous fetal movements  throughout the day.  On today's exam, the EFW measures at the less than the first  percentile for her gestational age indicating severe fetal  growth restriction.  The fetus has grown a little over half a  pound over the past 3 weeks.  There was normal amniotic  fluid noted.  A biophysical profile performed today due to fetal growth  restriction was 6 out of 8.  She received a -2 for fetal  breathing movements that did not meet criteria.  The patient  subsequently had a nonreactive NST with mild variable  decelerations, making her total biophysical profile score of 6  out of 10.  Doppler studies of the umbilical arteries showed an elevated  S/D ratio of 5.18.  There were no signs of absent or reversed  end-diastolic  flow.  Due to severe fetal growth restriction and concerns regarding  the fetal status, the patient was sent to the hospital following  today's exam for admission, continuous monitoring, to receive  a course of antenatal corticosteroids, and possibly delivery.  A total of 20 minutes was spent counseling and coordinating  the care for this patient.  Greater than 50% of the time spent  in direct face-to-face contact. ---------------------------------------------------------------------- Impression   weekly ----------------------------------------------------------------------                   Ma Rings, MD Electronically Signed Final Report   10/16/2020 10:46 am ----------------------------------------------------------------------  [place] MFM FETAL BPP W/NONSTRESS  Result Date: 10/09/2020 ----------------------------------------------------------------------  OBSTETRICS REPORT                       (Signed Final 10/09/2020 05:16 pm) ---------------------------------------------------------------------- Patient Info  ID #:       [id number]                          D.O.B.:  [birth year] (29 yrs)  Name:       Jerene Pitch                Visit Date: 10/09/2020 02:28  pm ---------------------------------------------------------------------- Performed By  Attending:        Noralee Space MD        Secondary Phy.:   [facility]                                                             [facility]  Performed By:     Truitt Leep,      Address:          [address]                    [place]                                                             [place], [place]                                                             [address]  Referred By:      Jerene Bears          Location:         [facility] ---------------------------------------------------------------------- Orders  #  Description                           Code        Ordered By  1  [place] MFM UA CORD DOPPLER                76820.02    CORENTHIAN  BOOKER  2  [place] MFM OB LIMITED                     [id number]    Lin Landsman  3  [place] MFM FETAL BPP                      [address].6     Lyndee Leo                                       BOOKER ----------------------------------------------------------------------  #  Order #                     Accession #                Episode #  1  [id number]                   [id number]                 [id number]  2  [id number]                   [id number]                 [id number]  3  [id number]                   [id number]                 [id number] ---------------------------------------------------------------------- Indications  Maternal care for known or suspected poor      O36.5930  fetal growth, third trimester, not applicable or  unspecified IUGR  Fetal Trisomy 21 affecting care of mother,     O35.1XX0  antepartum (per NIPS)  Fetal cardiac anomaly affecting pregnancy,     O35.8XX0  antepartum (known AV canal)  Hypertension - Chronic/Pre-                    O10.019  existing(Labetalol)  History of sickle cell trait                   Z86.2  Encounter for other antenatal screening        Z36.2  follow-up  [redacted] weeks gestation of pregnancy                Z3A.32 ---------------------------------------------------------------------- Fetal Evaluation  Num Of Fetuses:         1  Cardiac Activity:       Observed  Presentation:           Cephalic  Placenta:               Posterior  P. Cord Insertion:      Previously Visualized  Amniotic Fluid  AFI FV:      Within normal limits  AFI Sum(cm)     %Tile       Largest Pocket(cm)  14.91           52          4.69  RUQ(cm)       RLQ(cm)       LUQ(cm)        LLQ(cm)   3.58          4.21          4.69           4.69 ---------------------------------------------------------------------- Biophysical Evaluation  Amniotic F.V:   Pocket => 2 cm             F. Tone:        Observed  F. Movement:    Observed                   N.S.T:          Nonreactive  F. Breathing:   Observed                   Score:          8/10 ---------------------------------------------------------------------- OB History  Gravidity:    1         Term:   0        Prem:   0        SAB:   0  TOP:          0       Ectopic:  0        Living: 0 ---------------------------------------------------------------------- Gestational Age  LMP:           32w 2d        Date:  02/26/20                 EDD:   12/02/20  Best:          Armida Sans 2d     Det. By:  LMP  (02/26/20)          EDD:   12/02/20 ---------------------------------------------------------------------- Anatomy  Cranium:               Appears normal         LVOT:                   Appears normal  Face:                  Appears normal         Stomach:                Appears normal, left                         (orbits and profile)                           sided  Lips:                  Appears normal         Kidneys:                Appear normal  Heart:                 Complete AV            Bladder:                Appears normal                         canal, Known  RVOT:  Appears normal  Other:  LSVC identified. ---------------------------------------------------------------------- Doppler - Fetal Vessels  Umbilical Artery   S/D     %tile      RI    %tile      PI    %tile            ADFV    RDFV   4.01       96    0.75       95     1.4   > 97.5               No      No ---------------------------------------------------------------------- Impression  Severe fetal growth restriction.  Increased risk for Down  syndrome on cell free fetal DNA screening.  Patient had  opted not to have amniocentesis.  Chronic hypertension.  Well-controlled on labetalol.   Blood  pressure today at her office is 125/74 mmHg.  Patient does not have gestational diabetes.  She had fetal echocardiography that confirmed complete  balanced AV canal defect.  Today's ultrasound, amniotic fluid is normal good fetal activity  seen.  Complete balanced AV canal defect is seen.  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  Umbilical artery  Doppler showed increased S/D ratio.  Antenatal testing is  reassuring.  NST is nonreactive.  BPP 8/10.  Emailed Dr. Elizebeth Brooking to review the Echo findings in the light of  our new findings. ---------------------------------------------------------------------- Recommendations  Continue weekly BPP, NST, and UA Doppler till delivery. ----------------------------------------------------------------------                  Noralee Space, MD Electronically Signed Final Report   10/09/2020 05:16 pm ----------------------------------------------------------------------  [place] MFM FETAL BPP W/NONSTRESS  Result Date: 10/01/2020 ----------------------------------------------------------------------  OBSTETRICS REPORT                       (Signed Final 10/01/2020 01:19 pm) ---------------------------------------------------------------------- Patient Info  ID #:       [id number]                          D.O.B.:  10-08-1990 (29 yrs)  Name:       Jerene Pitch                Visit Date: 10/01/2020 07:31 am ---------------------------------------------------------------------- Performed By  Attending:        Ma Rings MD         Secondary Phy.:   [facility]                                                             [facility]  Performed By:     Sandi Mealy        Address:          [address]                    [place]  [place], [place]                                                             [address]  Referred By:      Jerene Bears          Location[facility]         [facility] ---------------------------------------------------------------------- Orders  #  Description                           Code        Ordered By  1  [place] MFM UA CORD DOPPLER                76820.02    Lin Landsman  2  [place] MFM FETAL BPP                      11914.7     Michaelene Song ----------------------------------------------------------------------  #  Order #                     Accession #                Episode #  1  [id number]                   [id number]                 [id number]  2  [id number]                   [id number]  [id number] ---------------------------------------------------------------------- Indications  Maternal care for known or suspected poor      O36.5930  fetal growth, third trimester, not applicable or  unspecified IUGR  Fetal Trisomy 21 affecting care of mother,     O32.1XX0  antepartum (per NIPS)  Fetal cardiac anomaly affecting pregnancy,     O35.8XX0  antepartum (known AV canal)  Hypertension - Chronic/Pre-                    O10.019  existing(Labetalol)  History of sickle cell trait                   Z86.2  Encounter for other antenatal screening        Z36.2  follow-up  [redacted] weeks gestation of pregnancy                Z3A.31 ---------------------------------------------------------------------- Fetal Evaluation  Num Of Fetuses:         1  Fetal Heart Rate(bpm):  138  Cardiac Activity:       Observed  Presentation:           Cephalic  Placenta:               Posterior  P. Cord Insertion:      Visualized  Amniotic Fluid  AFI FV:      Within normal limits  AFI Sum(cm)     %Tile       Largest Pocket(cm)  13.96           46          4.83  RUQ(cm)       RLQ(cm)       LUQ(cm)        LLQ(cm)   2.64          3.08          3.41           4.83 ---------------------------------------------------------------------- Biophysical Evaluation  Amniotic F.V:   Pocket => 2 cm             F. Tone:        Observed  F. Movement:    Observed                   N.S.T:          Reactive  F. Breathing:   Observed                   Score:          10/10 ---------------------------------------------------------------------- Biometry  LV:        3.9  mm ---------------------------------------------------------------------- OB History  Gravidity:    1         Term:   0        Prem:   0        SAB:   0  TOP:          0       Ectopic:  0        Living: 0 ---------------------------------------------------------------------- Gestational Age  LMP:           31w 1d        Date:  02/26/20                 EDD:   12/02/20  Best:          31w 1d     Det. By:  LMP  (02/26/20)  EDD:   12/02/20 ---------------------------------------------------------------------- Doppler - Fetal Vessels  Umbilical Artery   S/D     %tile      RI    %tile                             ADFV    RDFV    5.1   > 97.5     0.8   > 97.5                                No      No ---------------------------------------------------------------------- Comments  This patient was seen due to an IUGR fetus.  She denies any  problems since her last exam.  She reports feeling vigorous  fetal movements throughout the day.  A biophysical profile performed today was 8 out of 8.  She  also had a reactive nonstress test today making her total  biophysical profile score 10 out of 10.  There was normal amniotic fluid noted on today's ultrasound  exam.  Doppler studies of the umbilical arteries performed due to  fetal growth restriction shows an elevated S/D ratio of 5.1.  There were no signs of absent or reversed end-diastolic flow  noted today.  She will return in 1 week for another biophysical profile, NST,  and umbilical artery Doppler study.  Due to IUGR, delivery is  recommended at around 37 weeks. ----------------------------------------------------------------------                   Ma Rings, MD Electronically Signed Final Report   10/01/2020 01:19 pm ----------------------------------------------------------------------  [place] MFM FETAL BPP W/NONSTRESS  Result Date: 09/24/2020 ----------------------------------------------------------------------  OBSTETRICS REPORT                       (Signed Final 09/24/2020 10:48 am) ---------------------------------------------------------------------- Patient Info  ID #:       [id number]                          D.O.B.:  Apr 29, 1991 (29 yrs)  Name:       Jerene Pitch                Visit Date: 09/24/2020 07:34 am ---------------------------------------------------------------------- Performed By  Attending:        Noralee Space MD        Secondary Phy.:   [facility]                                                             [facility]  Performed By:     Tommie Raymond BS,       Address:          [address]                    [place]  [place], [place]                                                             [id number]  Referred By:      Jerene BearsMARY S MILLER          Location[facility]         [facility] ---------------------------------------------------------------------- Orders  #  Description                           Code        Ordered By  1  US MFM OB FOLLOW UP                   76816.01    RAVI SHANKAR  2  US MFM UA CORD DOPPLER                76820.02    RAVI SHANKAR  3  US MFM FETAL BPP                      [id number]     [facility]     W/[facility]ESS ----------------------------------------------------------------------  #  Order #                     Accession #                 Episode #  1  [id number]                   [id number]                 [id number]  2  [id number]                   [id number]                 [id number]  3  [id number]                   [id number]                 [id number] ---------------------------------------------------------------------- Indications  [redacted] weeks gestation of pregnancy  Z3A.30  Fetal Trisomy 21 affecting care of mother,     O62.1XX0  antepartum (per NIPS)  Fetal cardiac anomaly affecting pregnancy,     O35.8XX0  antepartum (known AV canal)  Hypertension - Chronic/Pre-                    O10.019  existing(Labetalol)  History of sickle cell trait                   Z86.2  Encounter for other antenatal screening        Z36.2  follow-up  Maternal care for known or suspected poor      O36.5930  fetal growth, third trimester, not applicable or  unspecified IUGR ---------------------------------------------------------------------- Fetal Evaluation  Num Of Fetuses:         1  Fetal Heart Rate(bpm):  144  Cardiac Activity:       Observed  Presentation:           Cephalic  Placenta:               Posterior  P. Cord Insertion:      Previously Visualized  Amniotic Fluid  AFI FV:      Within normal limits  AFI Sum(cm)     %Tile       Largest Pocket(cm)  12.2            31          4.8  RUQ(cm)       RLQ(cm)       LUQ(cm)        LLQ(cm)  4.8           3.1           4.3            0 ---------------------------------------------------------------------- Biophysical Evaluation  Amniotic F.V:   Pocket => 2 cm             F. Tone:        Observed  F. Movement:    Observed                   N.S.T:          Reactive  F. Breathing:   Observed                   Score:          10/10 ---------------------------------------------------------------------- Biometry  BPD:      72.8  mm     G. Age:  29w 1d         14  %    CI:        81.35   %    70 - 86                                                          FL/HC:      19.7   %    19.2 - 21.4  HC:       254.8  mm     G. Age:  27w 5d        < 1  %    HC/AC:      1.04        0.99 - 1.21  AC:  244.3  mm     G. Age:  28w 5d         10  %    FL/BPD:     69.0   %    71 - 87  FL:       50.2  mm     G. Age:  27w 0d        < 1  %    FL/AC:      20.5   %    20 - 24  CER:      37.8  mm     G. Age:  31w 1d         59  %  LV:        8.5  mm  Est. FW:    1167  gm      2 lb 9 oz    1.9  % ---------------------------------------------------------------------- OB History  Gravidity:    1         Term:   0        Prem:   0        SAB:   0  TOP:          0       Ectopic:  0        Living: 0 ---------------------------------------------------------------------- Gestational Age  LMP:           30w 1d        Date:  02/26/20                 EDD:   12/02/20  U/S Today:     28w 1d                                        EDD:   12/16/20  Best:          30w 1d     Det. By:  LMP  (02/26/20)          EDD:   12/02/20 ---------------------------------------------------------------------- Anatomy  Cranium:               Brachycephaly          Aortic Arch:            Previously seen  Cavum:                 Appears normal         Ductal Arch:            Previously seen  Ventricles:            Appears normal         Diaphragm:              Appears normal  Choroid Plexus:        Previously seen        Stomach:                Appears normal, left                                                                        sided  Cerebellum:            Appears normal         Abdomen:                Appears normal  Posterior Fossa:       Previously seen        Abdominal Wall:         Previously seen  Nuchal Fold:           Previously seen        Cord Vessels:           Previously seen  Face:                  Absent nasal bone      Kidneys:                Appear normal  Lips:                  Previously seen        Bladder:                Appears normal  Thoracic:              Appears normal         Spine:                  Previously seen  Heart:                  Abnormal, see          Upper Extremities:      Previously seen                         comments  RVOT:                  Previously seen        Lower Extremities:      Previously seen  LVOT:                  Previously seen  Other:  Heels and Open hands previously visualized. Technically difficult due          to advanced GA and fetal position. ---------------------------------------------------------------------- Doppler - Fetal Vessels  Umbilical Artery   S/D     %tile      RI    %tile      PI    %tile            ADFV    RDFV   5.41   > 97.5    0.82   > 97.5    1.59   > 97.5               No      No ---------------------------------------------------------------------- Cervix Uterus Adnexa  Cervix  Not visualized (advanced GA >24wks)  Uterus  No abnormality visualized.  Right Ovary  Within normal limits.  Left Ovary  Within normal limits.  Cul De Sac  No free fluid seen.  Adnexa  No abnormality visualized. ---------------------------------------------------------------------- Impression  Fetal growth restriction.  Increased risk for Down syndrome on cell free fetal DNA  screening.  Patient had opted not to have amniocentesis.  Patient has chronic hypertension and takes labetalol for  control.  Blood pressure today at her office is 127/83 mmHg  Fetal cardiac anomaly.  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  On  today's ultrasound, the estimated fetal weight is at the 2nd  percentile.  Abdominal circumference measurement is at the  10th percentile.  Amniotic fluid is normal and good fetal  activity seen.  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  Umbilical artery Doppler showed increased S/D ratio.  NST is  reactive for this gestational age.  I explained the finding of severe fetal growth restriction and  stable Doppler studies.  Timing of delivery will be based on  several factors including worsening of Doppler studies.  ---------------------------------------------------------------------- Recommendations  -BPP, NST and UA Doppler next week.  -Antenatal corticosteroids if absent end-diastolic flow is seen  on UAD. ----------------------------------------------------------------------                  Noralee Space, MD Electronically Signed Final Report   09/24/2020 10:48 am ----------------------------------------------------------------------  [place] MFM FETAL BPP W/NONSTRESS  Result Date: 09/17/2020 ----------------------------------------------------------------------  OBSTETRICS REPORT                       (Signed Final 09/17/2020 03:42 pm) ---------------------------------------------------------------------- Patient Info  ID #:       [id number]                          D.O.B.:  1991-03-21 (29 yrs)  Name:       Jerene Pitch                Visit Date: 09/17/2020 02:51 pm ---------------------------------------------------------------------- Performed By  Attending:        Lin Landsman      Secondary Phy.:   [facility]                    MD                                                             for Women  Performed By:     Eden Lathe BS      Address:          [address]                    [place]                                                             [place], [place]                                                             [address]  Referred By:      Jerene Bears          Location:         [facility]                                                             [facility]                                                             Women ---------------------------------------------------------------------- Orders  #  Description                           Code        Ordered By  1  [place] MFM UA CORD DOPPLER                76820.02    RAVI SHANKAR  2  [place] MFM FETAL BPP                      29528.4     RAVI [facility]      W/NONSTRESS ----------------------------------------------------------------------  #  Order #                     Accession #                Episode #  1  [id number]                   [id number]                 [id number]  2  [id number]                   [id number]                 [id number] ---------------------------------------------------------------------- Indications  [redacted] weeks gestation of pregnancy                Z3A.29  Fetal Trisomy 21 affecting care of mother,     O35.1XX0  antepartum (per NIPS)  Fetal cardiac anomaly affecting pregnancy,     O35.8XX0  antepartum (known AV canal)  Hypertension - Chronic/Pre-                    O10.019  existing(Labetalol)  History of sickle cell trait                   Z86.2  Encounter for other antenatal screening        Z36.2  follow-up  Maternal care for known or suspected poor      O36.5930  fetal growth, third trimester, not applicable or  unspecified IUGR ---------------------------------------------------------------------- Fetal Evaluation  Num Of Fetuses:         1  Fetal Heart Rate(bpm):  137  Cardiac Activity:       Observed  Presentation:           Cephalic  Amniotic Fluid  AFI FV:      Within normal limits  AFI Sum(cm)     %Tile       Largest Pocket(cm)  22.8  93          6.  RUQ(cm)       RLQ(cm)       LUQ(cm)        LLQ(cm)  6             5.7           5.7            5.4 ---------------------------------------------------------------------- Biophysical Evaluation  Amniotic F.V:   Within normal limits       F. Tone:        Observed  F. Movement:    Observed                   N.S.T:          Nonreactive  F. Breathing:   Observed                   Score:          8/10 ---------------------------------------------------------------------- OB History  Gravidity:    1         Term:   0        Prem:   0        SAB:   0  TOP:          0       Ectopic:  0        Living: 0 ---------------------------------------------------------------------- Gestational Age   LMP:           29w 1d        Date:  02/26/20                 EDD:   12/02/20  Best:          29w 1d     Det. By:  LMP  (02/26/20)          EDD:   12/02/20 ---------------------------------------------------------------------- Doppler - Fetal Vessels  Umbilical Artery   S/D     %tile      RI    %tile      PI    %tile     PSV    ADFV    RDFV                                                     (cm/s)   5.92   > 97.5    0.83   > 97.5    1.66   > 97.5    19.99      No      No ---------------------------------------------------------------------- Impression  Antenatal testing due to FGR with an EFW 5th% with an AC  < 18th% with elevated UA Dopplers.  Biophysical profile 8/8 with good fetal movement and  amniotic fluid volume  UA Dopplers are elevated with no evidence of AEDF or  REDF  BPP was 8/10 (-2 for NRNST)- the fetus becam very active  during the biophyiscal ---------------------------------------------------------------------- Recommendations  Continue weekly testing with UA Dopplers  Repeat growth in 1 week. ----------------------------------------------------------------------               Lin Landsman, MD Electronically Signed Final Report   09/17/2020 03:42 pm ----------------------------------------------------------------------  [place] MFM OB FOLLOW UP  Result Date: 10/16/2020 ----------------------------------------------------------------------  OBSTETRICS REPORT                       (  Signed Final 10/16/2020 10:46 am) ---------------------------------------------------------------------- Patient Info  ID #:       [id number]                          D.O.B.:  Jun 22, 1990 (29 yrs)  Name:       Jerene Pitch                Visit Date: 10/16/2020 08:17 am ---------------------------------------------------------------------- Performed By  Attending:        Ma Rings MD         Secondary Phy.:   [facility]                                                             [facility]  Performed By:     Jeanmarie Plant       Address:          [address]                    [place]                                                             [place], [place]                                                             [address]  Referred By:      Jerene Bears          Location:         [facility] ---------------------------------------------------------------------- Orders  #  Description                           Code        Ordered  By  1  [place] MFM OB FOLLOW UP                   [id number]    RAVI SHANKAR  2  [place] MFM UA CORD DOPPLER                76820.02    RAVI SHANKAR  3  [place] MFM FETAL BPP                      28413.2     RAVI [facility]     W/NONSTRESS ----------------------------------------------------------------------  #  Order #                     Accession #                Episode #  1  [id number]                   [id number]                 [id number]  2  [id number]                   [id number]                 [id number]  3  [id number]                   [id number]                 [id number] ---------------------------------------------------------------------- Indications  Maternal care for known or suspected poor      O36.5930  fetal growth, third trimester, not applicable or  unspecified IUGR  [redacted] weeks gestation of pregnancy                Z3A.33  Fetal Trisomy 21 affecting care of mother,     O35.1XX0  antepartum (per NIPS)  Fetal cardiac anomaly affecting pregnancy,     O35.8XX0  antepartum (known AV canal)  Hypertension - Chronic/Pre-                    O10.019  existing(Labetalol)  History of sickle cell trait                   Z86.2  Encounter for other antenatal screening        Z36.2  follow-up ---------------------------------------------------------------------- Fetal Evaluation  Num Of Fetuses:          1  Preg. Location:         Intrauterine  Fetal Heart Rate(bpm):  136  Cardiac Activity:       Observed  Presentation:           Cephalic  Placenta:               Posterior  P. Cord Insertion:      Previously Visualized  Amniotic Fluid  AFI FV:      Within normal limits  AFI Sum(cm)     %Tile       Largest Pocket(cm)  10.55           22          3.7  RUQ(cm)       RLQ(cm)       LUQ(cm)        LLQ(cm)  3.7           2.15  1.78           2.92 ---------------------------------------------------------------------- Biophysical Evaluation  Amniotic F.V:   Within normal limits       F. Tone:        Observed  F. Movement:    Observed                   N.S.T:          Nonreactive  F. Breathing:   Not Observed               Score:          6/10 ---------------------------------------------------------------------- Biometry  BPD:      75.2  mm     G. Age:  30w 1d        < 1  %    CI:        75.37   %    70 - 86                                                          FL/HC:      20.5   %    19.9 - 21.5  HC:      274.7  mm     G. Age:  30w 0d        < 1  %    HC/AC:      1.07        0.96 - 1.11  AC:      257.2  mm     G. Age:  29w 6d        < 1  %    FL/BPD:     74.9   %    71 - 87  FL:       56.3  mm     G. Age:  29w 4d        < 1  %    FL/AC:      21.9   %    20 - 24  HUM:      46.8  mm     G. Age:  27w 4d        < 5  %  LV:       10.3  mm  Est. FW:    1461  gm      3 lb 4 oz    < 1  % ---------------------------------------------------------------------- OB History  Gravidity:    1         Term:   0        Prem:   0        SAB:   0  TOP:          0       Ectopic:  0        Living: 0 ---------------------------------------------------------------------- Gestational Age  LMP:           33w 2d        Date:  02/26/20                 EDD:   12/02/20  U/S Today:     29w 6d  EDD:   12/26/20  Best:          33w 2d     Det. By:  LMP  (02/26/20)          EDD:   12/02/20  ---------------------------------------------------------------------- Anatomy  Cranium:               Brachycephaly          Aortic Arch:            Previously seen  Cavum:                 Appears normal         Ductal Arch:            Previously seen  Ventricles:            Ventriculomegaly       Diaphragm:              Previously seen                         10mm  Choroid Plexus:        Previously seen        Stomach:                Appears normal, left                                                                        sided  Cerebellum:            Previously seen        Abdomen:                Previously seen  Posterior Fossa:       Previously seen        Abdominal Wall:         Previously seen  Nuchal Fold:           Previously seen        Cord Vessels:           Previously seen  Face:                  previously seen        Kidneys:                Appear normal  Lips:                  Previously seen        Bladder:                Appears normal  Thoracic:              Previously seen        Spine:                  Previously seen  Heart:                 Previously seen        Upper Extremities:      Previously seen  RVOT:                  Previously seen  Lower Extremities:      Previously seen  LVOT:                  Previously seen  Other:  Heels and Open hands previously visualized. Technically difficult due          to advanced GA and fetal position. ---------------------------------------------------------------------- Doppler - Fetal Vessels  Umbilical Artery   S/D     %tile      RI    %tile      PI    %tile            ADFV    RDFV   5.18   > 97.5    0.81   > 97.5    1.63   > 97.5               No      No ---------------------------------------------------------------------- Cervix Uterus Adnexa  Cervix  Not visualized (advanced GA >24wks)  Uterus  Appears normal  Right Ovary  Not visualized  Left Ovary  Not visualized  Cul De Sac  No free fluid seen.  Adnexa  Appears normal  ---------------------------------------------------------------------- Comments  Jerene Pitch was seen for a follow up growth scan due to  fetal growth restriction noted during her prior ultrasound  exams.  Her pregnancy has been complicated by a positive  cell free DNA test showing an increased risk of Down  syndrome.  The fetus a complete balanced AV canal defect  with a left-sided superior vena cava.  The pediatric  cardiologist [facility]) has stated that the patient may deliver  here at [facility].  She denies any problems since her last  exam and reports feeling vigorous fetal movements  throughout the day.  On today's exam, the EFW measures at the less than the first  percentile for her gestational age indicating severe fetal  growth restriction.  The fetus has grown a little over half a  pound over the past 3 weeks.  There was normal amniotic  fluid noted.  A biophysical profile performed today due to fetal growth  restriction was 6 out of 8.  She received a -2 for fetal  breathing movements that did not meet criteria.  The patient  subsequently had a nonreactive NST with mild variable  decelerations, making her total biophysical profile score of 6  out of 10.  Doppler studies of the umbilical arteries showed an elevated  S/D ratio of 5.18.  There were no signs of absent or reversed  end-diastolic flow.  Due to severe fetal growth restriction and concerns regarding  the fetal status, the patient was sent to the hospital following  today's exam for admission, continuous monitoring, to receive  a course of antenatal corticosteroids, and possibly delivery.  A total of 20 minutes was spent counseling and coordinating  the care for this patient.  Greater than 50% of the time spent  in direct face-to-face contact. ---------------------------------------------------------------------- Impression   weekly ----------------------------------------------------------------------                   Ma Rings, MD  Electronically Signed Final Report   10/16/2020 10:46 am ----------------------------------------------------------------------  [place] MFM OB FOLLOW UP  Result Date: 09/24/2020 ----------------------------------------------------------------------  OBSTETRICS REPORT                       (Signed Final 09/24/2020 10:48 am) ---------------------------------------------------------------------- Patient Info  ID #:       [id number]  D.O.B.:  Apr 04, 1991 (29 yrs)  Name:       CHAUNTEL WINDSOR                Visit Date: 09/24/2020 07:34 am ---------------------------------------------------------------------- Performed By  Attending:        Noralee Space MD        Secondary Phy.:   [facility]                                                             for Women  Performed By:     Tommie Raymond BS,       Address:          [address]                    [place] [place]                                                             [place], [place]                                                             [address]  Referred By:      Jerene Bears          Location:         [facility]                                                             [facility] ---------------------------------------------------------------------- Orders  #  Description                           Code        Ordered By  1  [place] MFM OB FOLLOW UP                   60454.09    RAVI SHANKAR  2  [place] MFM UA CORD DOPPLER  16109.60    RAVI SHANKAR  3  [place] MFM FETAL BPP                      45409.8     [facility]     W/NONSTRESS ----------------------------------------------------------------------  #  Order #                     Accession #                Episode #  1  [id number]                   [id number]                 [id number]  2  [id number]                   [id number]                  [id number]  3  [id number]                   [id number]                 [id number] ---------------------------------------------------------------------- Indications  [redacted] weeks gestation of pregnancy                Z3A.30  Fetal Trisomy 21 affecting care of mother,     O65.1XX0  antepartum (per NIPS)  Fetal cardiac anomaly affecting pregnancy,     O35.8XX0  antepartum (known AV canal)  Hypertension - Chronic/Pre-                    O10.019  existing(Labetalol)  History of sickle cell trait                   Z86.2  Encounter for other antenatal screening        Z36.2  follow-up  Maternal care for known or suspected poor      O36.5930  fetal growth, third trimester, not applicable or  unspecified IUGR ---------------------------------------------------------------------- Fetal Evaluation  Num Of Fetuses:         1  Fetal Heart Rate(bpm):  144  Cardiac Activity:       Observed  Presentation:           Cephalic  Placenta:               Posterior  P. Cord Insertion:      Previously Visualized  Amniotic Fluid  AFI FV:      Within normal limits  AFI Sum(cm)     %Tile       Largest Pocket(cm)  12.2            31          4.8  RUQ(cm)       RLQ(cm)       LUQ(cm)        LLQ(cm)  4.8           3.1           4.3            0 ---------------------------------------------------------------------- Biophysical Evaluation  Amniotic F.V:   Pocket => 2 cm             F. Tone:        Observed  F. Movement:    Observed  N.S.T:          Reactive  F. Breathing:   Observed                   Score:          10/10 ---------------------------------------------------------------------- Biometry  BPD:      72.8  mm     G. Age:  29w 1d         14  %    CI:        81.35   %    70 - 86                                                          FL/HC:      19.7   %    19.2 - 21.4  HC:      254.8  mm     G. Age:  27w 5d        < 1  %    HC/AC:      1.04        0.99 - 1.21  AC:      244.3  mm     G. Age:  28w 5d         10  %     FL/BPD:     69.0   %    71 - 87  FL:       50.2  mm     G. Age:  27w 0d        < 1  %    FL/AC:      20.5   %    20 - 24  CER:      37.8  mm     G. Age:  31w 1d         59  %  LV:        8.5  mm  Est. FW:    1167  gm      2 lb 9 oz    1.9  % ---------------------------------------------------------------------- OB History  Gravidity:    1         Term:   0        Prem:   0        SAB:   0  TOP:          0       Ectopic:  0        Living: 0 ---------------------------------------------------------------------- Gestational Age  LMP:           30w 1d        Date:  02/26/20                 EDD:   12/02/20  U/S Today:     28w 1d                                        EDD:   12/16/20  Best:          30w 1d     Det. By:  LMP  (02/26/20)          EDD:   12/02/20 ---------------------------------------------------------------------- Anatomy  Cranium:  Brachycephaly          Aortic Arch:            Previously seen  Cavum:                 Appears normal         Ductal Arch:            Previously seen  Ventricles:            Appears normal         Diaphragm:              Appears normal  Choroid Plexus:        Previously seen        Stomach:                Appears normal, left                                                                        sided  Cerebellum:            Appears normal         Abdomen:                Appears normal  Posterior Fossa:       Previously seen        Abdominal Wall:         Previously seen  Nuchal Fold:           Previously seen        Cord Vessels:           Previously seen  Face:                  Absent nasal bone      Kidneys:                Appear normal  Lips:                  Previously seen        Bladder:                Appears normal  Thoracic:              Appears normal         Spine:                  Previously seen  Heart:                 Abnormal, see          Upper Extremities:      Previously seen                         comments  RVOT:                  Previously  seen        Lower Extremities:      Previously seen  LVOT:                  Previously seen  Other:  Heels and Open hands previously visualized. Technically difficult due  to advanced GA and fetal position. ---------------------------------------------------------------------- Doppler - Fetal Vessels  Umbilical Artery   S/D     %tile      RI    %tile      PI    %tile            ADFV    RDFV   5.41   > 97.5    0.82   > 97.5    1.59   > 97.5               No      No ---------------------------------------------------------------------- Cervix Uterus Adnexa  Cervix  Not visualized (advanced GA >24wks)  Uterus  No abnormality visualized.  Right Ovary  Within normal limits.  Left Ovary  Within normal limits.  Cul De Sac  No free fluid seen.  Adnexa  No abnormality visualized. ---------------------------------------------------------------------- Impression  Fetal growth restriction.  Increased risk for Down syndrome on cell free fetal DNA  screening.  Patient had opted not to have amniocentesis.  Patient has chronic hypertension and takes labetalol for  control.  Blood pressure today at her office is 127/83 mmHg  Fetal cardiac anomaly.  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  On today's ultrasound, the estimated fetal weight is at the 2nd  percentile.  Abdominal circumference measurement is at the  10th percentile.  Amniotic fluid is normal and good fetal  activity seen.  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  Umbilical artery Doppler showed increased S/D ratio.  NST is  reactive for this gestational age.  I explained the finding of severe fetal growth restriction and  stable Doppler studies.  Timing of delivery will be based on  several factors including worsening of Doppler studies. ---------------------------------------------------------------------- Recommendations  -BPP, NST and UA Doppler next week.  -Antenatal corticosteroids if absent end-diastolic flow is seen   on UAD. ----------------------------------------------------------------------                  Noralee Space, MD Electronically Signed Final Report   09/24/2020 10:48 am ----------------------------------------------------------------------  [place] MFM OB LIMITED  Result Date: 10/09/2020 ----------------------------------------------------------------------  OBSTETRICS REPORT                       (Signed Final 10/09/2020 05:16 pm) ---------------------------------------------------------------------- Patient Info  ID #:       [id number]                          D.O.B.:  1990-08-02 (29 yrs)  Name:       Jerene Pitch                Visit Date: 10/09/2020 02:28 pm ---------------------------------------------------------------------- Performed By  Attending:        Noralee Space MD        Secondary Phy.:   [facility]                                                             [facility]  Performed By:     Truitt Leep,      Address:          [address]  [place]                                                             [place], [place]                                                             [address]  Referred By:      Jerene Bears          Location:         [facility] ---------------------------------------------------------------------- Orders  #  Description                           Code        Ordered By  1  [place] MFM UA CORD DOPPLER                76820.02    Lin Landsman  2  [place] MFM OB LIMITED                     76815.01    Lin Landsman  3  [place] MFM FETAL BPP                      56213.0     [facility]     W/NONSTRESS  BOOKER ----------------------------------------------------------------------  #  Order #                     Accession #                Episode #  1  [id number]                   [id number]                 [id number]  2  [id number]                   [id number]                 [id number]  3  [id number]                   [id number]                 [id number] ---------------------------------------------------------------------- Indications  Maternal care for known or suspected poor      O36.5930  fetal growth, third trimester, not applicable or  unspecified IUGR  Fetal Trisomy 21 affecting care of mother,     O35.1XX0  antepartum (per NIPS)  Fetal cardiac anomaly affecting pregnancy,     O35.8XX0  antepartum (known AV canal)  Hypertension - Chronic/Pre-                    O10.019  existing(Labetalol)  History of sickle cell trait                   Z86.2  Encounter for other antenatal screening        Z36.2  follow-up  [redacted] weeks gestation of pregnancy                Z3A.32 ---------------------------------------------------------------------- Fetal Evaluation  Num Of Fetuses:         1  Cardiac Activity:       Observed  Presentation:           Cephalic  Placenta:               Posterior  P. Cord Insertion:      Previously Visualized  Amniotic Fluid  AFI FV:      Within normal limits  AFI Sum(cm)     %Tile       Largest Pocket(cm)  14.91           52          4.69  RUQ(cm)       RLQ(cm)       LUQ(cm)        LLQ(cm)  3.58          4.21          4.69           4.69 ---------------------------------------------------------------------- Biophysical Evaluation  Amniotic F.V:   Pocket => 2 cm             F. Tone:        Observed  F. Movement:    Observed                   N.S.T:          Nonreactive  F. Breathing:   Observed                   Score:          8/10 ---------------------------------------------------------------------- OB History  Gravidity:    1         Term:   0        Prem:   0        SAB:   0  TOP:          0        Ectopic:  0        Living: 0 ---------------------------------------------------------------------- Gestational Age  LMP:           32w 2d        Date:  02/26/20                 EDD:   12/02/20  Best:          Armida Sans 2d     Det. By:  LMP  (02/26/20)          EDD:   12/02/20 ---------------------------------------------------------------------- Anatomy  Cranium:               Appears normal         LVOT:                   Appears normal  Face:                  Appears normal         Stomach:                Appears normal, left                         (orbits and profile)                           sided  Lips:                  Appears normal         Kidneys:                Appear normal  Heart:                 Complete AV            Bladder:                Appears normal                         canal, Known  RVOT:                  Appears normal  Other:  LSVC identified. ---------------------------------------------------------------------- Doppler - Fetal Vessels  Umbilical Artery   S/D     %tile      RI    %tile      PI    %tile            ADFV    RDFV   4.01       96    0.75       95     1.4   > 97.5               No      No ---------------------------------------------------------------------- Impression  Severe fetal growth restriction.  Increased risk for Down  syndrome on cell free fetal DNA screening.  Patient had  opted not to have amniocentesis.  Chronic hypertension.  Well-controlled on labetalol.  Blood  pressure today at her office is  125/74 mmHg.  Patient does not have gestational diabetes.  She had fetal echocardiography that confirmed complete  balanced AV canal defect.  Today's ultrasound, amniotic fluid is normal good fetal activity  seen.  Complete balanced AV canal defect is seen.  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  Umbilical artery  Doppler showed increased S/D ratio.  Antenatal testing is  reassuring.  NST is nonreactive.  BPP 8/10.  Emailed Dr. Elizebeth Brooking to review the Echo findings in  the light of  our new findings. ---------------------------------------------------------------------- Recommendations  Continue weekly BPP, NST, and UA Doppler till delivery. ----------------------------------------------------------------------                  Noralee Space, MD Electronically Signed Final Report   10/09/2020 05:16 pm ----------------------------------------------------------------------  [place] MFM UA CORD DOPPLER  Result Date: 10/16/2020 ----------------------------------------------------------------------  OBSTETRICS REPORT                       (Signed Final 10/16/2020 10:46 am) ---------------------------------------------------------------------- Patient Info  ID #:       [id number]                          D.O.B.:  [birth year] (29 yrs)  Name:       Jerene Pitch                Visit Date: 10/16/2020 08:17 am ---------------------------------------------------------------------- Performed By  Attending:        Ma Rings MD         Secondary Phy.:   [facility]                                                             [facility]  Performed By:     Jeanmarie Plant       Address:          [address]                                                             [place], [place]                                                             [address]  Referred By:      Jerene Bears          Location:         [facility]                                                             [facility]                                                             Women ---------------------------------------------------------------------- Orders  #  Description                           Code        Ordered By  1  [place] MFM OB FOLLOW UP                   76816.01    RAVI SHANKAR  2  [place] MFM UA CORD DOPPLER                76820.02    RAVI SHANKAR  3  [place] MFM FETAL BPP                      16109.6     RAVI  [facility]     W/NONSTRESS ----------------------------------------------------------------------  #  Order #                     Accession #                Episode #  1  [id number]                   [id number]                 [id number]  2  [id number]                   [id number]                 [id number]  3  [id number]                   [id number]                 [id number] ---------------------------------------------------------------------- Indications  Maternal care for known or suspected poor      O36.5930  fetal growth, third trimester, not applicable or  unspecified IUGR  [redacted] weeks gestation of pregnancy                Z3A.33  Fetal Trisomy 21 affecting care of mother,     O35.1XX0  antepartum (per NIPS)  Fetal cardiac anomaly affecting pregnancy,     O35.8XX0  antepartum (known AV canal)  Hypertension - Chronic/Pre-                    O10.019  existing(Labetalol)  History of sickle cell trait                   Z86.2  Encounter for other antenatal screening        Z36.2  follow-up ---------------------------------------------------------------------- Fetal Evaluation  Num Of Fetuses:         1  Preg. Location:         Intrauterine  Fetal Heart Rate(bpm):  136  Cardiac Activity:       Observed  Presentation:           Cephalic  Placenta:               Posterior  P. Cord Insertion:      Previously Visualized  Amniotic Fluid  AFI FV:      Within normal limits  AFI Sum(cm)     %Tile       Largest Pocket(cm)  10.55           22          3.7  RUQ(cm)       RLQ(cm)       LUQ(cm)        LLQ(cm)  3.7           2.15          1.78           2.92 ---------------------------------------------------------------------- Biophysical Evaluation  Amniotic F.V:   Within normal limits       F. Tone:        Observed  F. Movement:    Observed                   N.S.T:          Nonreactive  F. Breathing:   Not Observed               Score:          6/10 ---------------------------------------------------------------------- Biometry  BPD:       75.2  mm     G. Age:  30w 1d        < 1  %    CI:        75.37   %    70 - 86                                                          FL/HC:      20.5   %    19.9 - 21.5  HC:      274.7  mm     G. Age:  30w 0d        < 1  %    HC/AC:      1.07        0.96 - 1.11  AC:      257.2  mm     G. Age:  29w 6d        < 1  %    FL/BPD:     74.9   %    71 - 87  FL:       56.3  mm     G. Age:  29w 4d        < 1  %    FL/AC:      21.9   %    20 - 24  HUM:      46.8  mm     G. Age:  27w 4d        < 5  %  LV:       10.3  mm  Est. FW:    1461  gm  3 lb 4 oz    < 1  % ---------------------------------------------------------------------- OB History  Gravidity:    1         Term:   0        Prem:   0        SAB:   0  TOP:          0       Ectopic:  0        Living: 0 ---------------------------------------------------------------------- Gestational Age  LMP:           33w 2d        Date:  02/26/20                 EDD:   12/02/20  U/S Today:     29w 6d                                        EDD:   12/26/20  Best:          33w 2d     Det. By:  LMP  (02/26/20)          EDD:   12/02/20 ---------------------------------------------------------------------- Anatomy  Cranium:               Brachycephaly          Aortic Arch:            Previously seen  Cavum:                 Appears normal         Ductal Arch:            Previously seen  Ventricles:            Ventriculomegaly       Diaphragm:              Previously seen                         10mm  Choroid Plexus:        Previously seen        Stomach:                Appears normal, left                                                                        sided  Cerebellum:            Previously seen        Abdomen:                Previously seen  Posterior Fossa:       Previously seen        Abdominal Wall:         Previously seen  Nuchal Fold:           Previously seen        Cord Vessels:           Previously seen  Face:  previously seen        Kidneys:                 Appear normal  Lips:                  Previously seen        Bladder:                Appears normal  Thoracic:              Previously seen        Spine:                  Previously seen  Heart:                 Previously seen        Upper Extremities:      Previously seen  RVOT:                  Previously seen        Lower Extremities:      Previously seen  LVOT:                  Previously seen  Other:  Heels and Open hands previously visualized. Technically difficult due          to advanced GA and fetal position. ---------------------------------------------------------------------- Doppler - Fetal Vessels  Umbilical Artery   S/D     %tile      RI    %tile      PI    %tile            ADFV    RDFV   5.18   > 97.5    0.81   > 97.5    1.63   > 97.5               No      No ---------------------------------------------------------------------- Cervix Uterus Adnexa  Cervix  Not visualized (advanced GA >24wks)  Uterus  Appears normal  Right Ovary  Not visualized  Left Ovary  Not visualized  Cul De Sac  No free fluid seen.  Adnexa  Appears normal ---------------------------------------------------------------------- Comments  Jerene Pitch was seen for a follow up growth scan due to  fetal growth restriction noted during her prior ultrasound  exams.  Her pregnancy has been complicated by a positive  cell free DNA test showing an increased risk of Down  syndrome.  The fetus a complete balanced AV canal defect  with a left-sided superior vena cava.  The pediatric  cardiologist [facility]) has stated that the patient may deliver  here at [facility].  She denies any problems since her last  exam and reports feeling vigorous fetal movements  throughout the day.  On today's exam, the EFW measures at the less than the first  percentile for her gestational age indicating severe fetal  growth restriction.  The fetus has grown a little over half a  pound over the past 3 weeks.  There was normal amniotic  fluid noted.  A  biophysical profile performed today due to fetal growth  restriction was 6 out of 8.  She received a -2 for fetal  breathing movements that did not meet criteria.  The patient  subsequently had a nonreactive NST with mild variable  decelerations, making her total biophysical profile score of 6  out of 10.  Doppler studies of the umbilical arteries showed an elevated  S/D ratio of 5.18.  There were no signs of absent or reversed  end-diastolic flow.  Due to severe fetal growth restriction and concerns regarding  the fetal status, the patient was sent to the hospital following  today's exam for admission, continuous monitoring, to receive  a course of antenatal corticosteroids, and possibly delivery.  A total of 20 minutes was spent counseling and coordinating  the care for this patient.  Greater than 50% of the time spent  in direct face-to-face contact. ---------------------------------------------------------------------- Impression   weekly ----------------------------------------------------------------------                   Ma Rings, MD Electronically Signed Final Report   10/16/2020 10:46 am ----------------------------------------------------------------------  [place] MFM UA CORD DOPPLER  Result Date: 10/09/2020 ----------------------------------------------------------------------  OBSTETRICS REPORT                       (Signed Final 10/09/2020 05:16 pm) ---------------------------------------------------------------------- Patient Info  ID #:       [id number]                          D.O.B.:  May 01, 1991 (29 yrs)  Name:       Jerene Pitch                Visit Date: 10/09/2020 02:28 pm ---------------------------------------------------------------------- Performed By  Attending:        Noralee Space MD        Secondary Phy.:   [facility]                                                             for [facility]  Performed By:     Truitt Leep,      Address:          [address]                     [place]                                                             [place], [place]                                                             [address]  Referred By:      Jerene Bears          Location:         [facility] ---------------------------------------------------------------------- Orders  #  Description                           Code        Ordered By  1  [place] MFM UA CORD DOPPLER                76820.02    Lin Landsman  2  [place] MFM OB LIMITED                     [id number]    Lin Landsman  3  [place] MFM FETAL BPP                      16109.6     [facility]     W/NONSTRESS                                       BOOKER ----------------------------------------------------------------------  #  Order #                     Accession #                Episode #  1  [id number]                   [id number]                 [id number]  2  [id number]                   [id number]                 [id number]  3  [id number]                   [id number]                 [id number] ---------------------------------------------------------------------- Indications  Maternal care for known or suspected poor      O36.5930  fetal growth, third trimester, not applicable or  unspecified IUGR  Fetal Trisomy 21 affecting care of mother,     O35.1XX0  antepartum (per NIPS)  Fetal cardiac anomaly affecting pregnancy,     O35.8XX0  antepartum (known AV canal)  Hypertension - Chronic/Pre-  O10.019  existing(Labetalol)  History of sickle cell trait                   Z86.2  Encounter for other antenatal screening        Z36.2  follow-up  [redacted] weeks gestation of pregnancy                Z3A.32  ---------------------------------------------------------------------- Fetal Evaluation  Num Of Fetuses:         1  Cardiac Activity:       Observed  Presentation:           Cephalic  Placenta:               Posterior  P. Cord Insertion:      Previously Visualized  Amniotic Fluid  AFI FV:      Within normal limits  AFI Sum(cm)     %Tile       Largest Pocket(cm)  14.91           52          4.69  RUQ(cm)       RLQ(cm)       LUQ(cm)        LLQ(cm)  3.58          4.21          4.69           4.69 ---------------------------------------------------------------------- Biophysical Evaluation  Amniotic F.V:   Pocket => 2 cm             F. Tone:        Observed  F. Movement:    Observed                   N.S.T:          Nonreactive  F. Breathing:   Observed                   Score:          8/10 ---------------------------------------------------------------------- OB History  Gravidity:    1         Term:   0        Prem:   0        SAB:   0  TOP:          0       Ectopic:  0        Living: 0 ---------------------------------------------------------------------- Gestational Age  LMP:           32w 2d        Date:  02/26/20                 EDD:   12/02/20  Best:          Armida Sans 2d     Det. By:  LMP  (02/26/20)          EDD:   12/02/20 ---------------------------------------------------------------------- Anatomy  Cranium:               Appears normal         LVOT:                   Appears normal  Face:                  Appears normal         Stomach:                Appears  normal, left                         (orbits and profile)                           sided  Lips:                  Appears normal         Kidneys:                Appear normal  Heart:                 Complete AV            Bladder:                Appears normal                         canal, Known  RVOT:                  Appears normal  Other:  LSVC identified. ---------------------------------------------------------------------- Doppler - Fetal Vessels   Umbilical Artery   S/D     %tile      RI    %tile      PI    %tile            ADFV    RDFV   4.01       96    0.75       95     1.4   > 97.5               No      No ---------------------------------------------------------------------- Impression  Severe fetal growth restriction.  Increased risk for Down  syndrome on cell free fetal DNA screening.  Patient had  opted not to have amniocentesis.  Chronic hypertension.  Well-controlled on labetalol.  Blood  pressure today at her office is 125/74 mmHg.  Patient does not have gestational diabetes.  She had fetal echocardiography that confirmed complete  balanced AV canal defect.  Today's ultrasound, amniotic fluid is normal good fetal activity  seen.  Complete balanced AV canal defect is seen.  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  Umbilical artery  Doppler showed increased S/D ratio.  Antenatal testing is  reassuring.  NST is nonreactive.  BPP 8/10.  Emailed Dr. Elizebeth Brooking to review the Echo findings in the light of  our new findings. ---------------------------------------------------------------------- Recommendations  Continue weekly BPP, NST, and UA Doppler till delivery. ----------------------------------------------------------------------                  Noralee Space, MD Electronically Signed Final Report   10/09/2020 05:16 pm ----------------------------------------------------------------------  [place] MFM UA CORD DOPPLER  Result Date: 10/01/2020 ----------------------------------------------------------------------  OBSTETRICS REPORT                       (Signed Final 10/01/2020 01:19 pm) ---------------------------------------------------------------------- Patient Info  ID #:       [id number]                          D.O.B.:  June 27, 1990 (29 yrs)  Name:       Jerene Pitch                Visit Date: 10/01/2020 07:31  am ---------------------------------------------------------------------- Performed By  Attending:        Ma Rings MD         Secondary  Phy.:   [facility]                                                             [facility]  Performed By:     Sandi Mealy        Address:          [address]                    [address]                                                             [place], [place]                                                             [address]  Referred By:      Jerene Bears          [facility] ---------------------------------------------------------------------- Orders  #  Description                           Code        Ordered By  1  [place] MFM UA CORD DOPPLER                76820.02    CORENTHIAN  BOOKER  2  [place] MFM FETAL BPP                      [address].6     Michaelene Song ----------------------------------------------------------------------  #  Order #                     Accession #                Episode #  1  [id number]                   [id number]                 [id number]  2  [id number]                   [id number]                 [id number] ---------------------------------------------------------------------- Indications  Maternal care for known or suspected poor      O36.5930  fetal growth, third trimester, not applicable or  unspecified IUGR  Fetal Trisomy 21 affecting care of mother,     O35.1XX0  antepartum (per NIPS)  Fetal cardiac anomaly affecting pregnancy,     O35.8XX0  antepartum (known AV canal)  Hypertension - Chronic/Pre-                    O10.019  existing(Labetalol)  History of sickle cell trait                   Z86.2  Encounter for other antenatal screening        Z36.2  follow-up  [redacted] weeks gestation of pregnancy                Z3A.31  ---------------------------------------------------------------------- Fetal Evaluation  Num Of Fetuses:         1  Fetal Heart Rate(bpm):  138  Cardiac Activity:       Observed  Presentation:           Cephalic  Placenta:               Posterior  P. Cord Insertion:      Visualized  Amniotic Fluid  AFI FV:      Within normal limits  AFI Sum(cm)     %Tile       Largest Pocket(cm)  13.96           46          4.83  RUQ(cm)       RLQ(cm)       LUQ(cm)        LLQ(cm)  2.64          3.08          3.41           4.83 ---------------------------------------------------------------------- Biophysical Evaluation  Amniotic F.V:   Pocket => 2 cm             F. Tone:        Observed  F. Movement:    Observed                   N.S.T:  Reactive  F. Breathing:   Observed                   Score:          10/10 ---------------------------------------------------------------------- Biometry  LV:        3.9  mm ---------------------------------------------------------------------- OB History  Gravidity:    1         Term:   0        Prem:   0        SAB:   0  TOP:          0       Ectopic:  0        Living: 0 ---------------------------------------------------------------------- Gestational Age  LMP:           31w 1d        Date:  02/26/20                 EDD:   12/02/20  Best:          31w 1d     Det. By:  LMP  (02/26/20)          EDD:   12/02/20 ---------------------------------------------------------------------- Doppler - Fetal Vessels  Umbilical Artery   S/D     %tile      RI    %tile                             ADFV    RDFV    5.1   > 97.5     0.8   > 97.5                                No      No ---------------------------------------------------------------------- Comments  This patient was seen due to an IUGR fetus.  She denies any  problems since her last exam.  She reports feeling vigorous  fetal movements throughout the day.  A biophysical profile performed today was 8 out of 8.  She  also had a reactive  nonstress test today making her total  biophysical profile score 10 out of 10.  There was normal amniotic fluid noted on today's ultrasound  exam.  Doppler studies of the umbilical arteries performed due to  fetal growth restriction shows an elevated S/D ratio of 5.1.  There were no signs of absent or reversed end-diastolic flow  noted today.  She will return in 1 week for another biophysical profile, NST,  and umbilical artery Doppler study.  Due to IUGR, delivery is recommended at around 37 weeks. ----------------------------------------------------------------------                   Ma Rings, MD Electronically Signed Final Report   10/01/2020 01:19 pm ----------------------------------------------------------------------  [place] MFM UA CORD DOPPLER  Result Date: 09/24/2020 ----------------------------------------------------------------------  OBSTETRICS REPORT                       (Signed Final 09/24/2020 10:48 am) ---------------------------------------------------------------------- Patient Info  ID #:       [id number]                          D.O.B.:  03-Jun-1990 (29 yrs)  Name:       Jerene Pitch  Visit Date: 09/24/2020 07:34 am ---------------------------------------------------------------------- Performed By  Attending:        Noralee Space MD        Secondary Phy.:   [facility]                                                             for Women  Performed By:     Tommie Raymond BS,       Address:          [address]                    [address]                                                             [place], [place]                                                             [address]  Referred By:      Jerene Bears          Location[facility]         [facility]  ---------------------------------------------------------------------- Orders  #  Description                           Code        Ordered By  1  [place] MFM OB FOLLOW UP                   76816.01    RAVI SHANKAR  2  [place] MFM UA CORD DOPPLER                60454.09    RAVI SHANKAR  3  [place] MFM FETAL BPP  53664.4     RAVI [facility]     W/NONSTRESS ----------------------------------------------------------------------  #  Order #                     Accession #                Episode #  1  [id number]                   [id number]                 [id number]  2  [id number]                   [id number]                 [id number]  3  [id number]                   [id number]                 [id number] ---------------------------------------------------------------------- Indications  [redacted] weeks gestation of pregnancy                Z3A.30  Fetal Trisomy 21 affecting care of mother,     O55.1XX0  antepartum (per NIPS)  Fetal cardiac anomaly affecting pregnancy,     O35.8XX0  antepartum (known AV canal)  Hypertension - Chronic/Pre-                    O10.019  existing(Labetalol)  History of sickle cell trait                   Z86.2  Encounter for other antenatal screening        Z36.2  follow-up  Maternal care for known or suspected poor      O36.5930  fetal growth, third trimester, not applicable or  unspecified IUGR ---------------------------------------------------------------------- Fetal Evaluation  Num Of Fetuses:         1  Fetal Heart Rate(bpm):  144  Cardiac Activity:       Observed  Presentation:           Cephalic  Placenta:               Posterior  P. Cord Insertion:      Previously Visualized  Amniotic Fluid  AFI FV:      Within normal limits  AFI Sum(cm)     %Tile       Largest Pocket(cm)  12.2            31          4.8  RUQ(cm)       RLQ(cm)       LUQ(cm)        LLQ(cm)  4.8           3.1           4.3            0 ---------------------------------------------------------------------- Biophysical Evaluation   Amniotic F.V:   Pocket => 2 cm             F. Tone:        Observed  F. Movement:    Observed                   N.S.T:          Reactive  F. Breathing:   Observed  Score:          10/10 ---------------------------------------------------------------------- Biometry  BPD:      72.8  mm     G. Age:  29w 1d         14  %    CI:        81.35   %    70 - 86                                                          FL/HC:      19.7   %    19.2 - 21.4  HC:      254.8  mm     G. Age:  27w 5d        < 1  %    HC/AC:      1.04        0.99 - 1.21  AC:      244.3  mm     G. Age:  28w 5d         10  %    FL/BPD:     69.0   %    71 - 87  FL:       50.2  mm     G. Age:  27w 0d        < 1  %    FL/AC:      20.5   %    20 - 24  CER:      37.8  mm     G. Age:  31w 1d         59  %  LV:        8.5  mm  Est. FW:    1167  gm      2 lb 9 oz    1.9  % ---------------------------------------------------------------------- OB History  Gravidity:    1         Term:   0        Prem:   0        SAB:   0  TOP:          0       Ectopic:  0        Living: 0 ---------------------------------------------------------------------- Gestational Age  LMP:           30w 1d        Date:  02/26/20                 EDD:   12/02/20  U/S Today:     28w 1d                                        EDD:   12/16/20  Best:          30w 1d     Det. By:  LMP  (02/26/20)          EDD:   12/02/20 ---------------------------------------------------------------------- Anatomy  Cranium:               Brachycephaly          Aortic Arch:  Previously seen  Cavum:                 Appears normal         Ductal Arch:            Previously seen  Ventricles:            Appears normal         Diaphragm:              Appears normal  Choroid Plexus:        Previously seen        Stomach:                Appears normal, left                                                                        sided  Cerebellum:            Appears normal         Abdomen:                 Appears normal  Posterior Fossa:       Previously seen        Abdominal Wall:         Previously seen  Nuchal Fold:           Previously seen        Cord Vessels:           Previously seen  Face:                  Absent nasal bone      Kidneys:                Appear normal  Lips:                  Previously seen        Bladder:                Appears normal  Thoracic:              Appears normal         Spine:                  Previously seen  Heart:                 Abnormal, see          Upper Extremities:      Previously seen                         comments  RVOT:                  Previously seen        Lower Extremities:      Previously seen  LVOT:                  Previously seen  Other:  Heels and Open hands previously visualized. Technically difficult due          to advanced GA and fetal position. ---------------------------------------------------------------------- Doppler - Fetal Vessels  Umbilical Artery   S/D     %  tile      RI    %tile      PI    %tile            ADFV    RDFV   5.41   > 97.5    0.82   > 97.5    1.59   > 97.5               No      No ---------------------------------------------------------------------- Cervix Uterus Adnexa  Cervix  Not visualized (advanced GA >24wks)  Uterus  No abnormality visualized.  Right Ovary  Within normal limits.  Left Ovary  Within normal limits.  Cul De Sac  No free fluid seen.  Adnexa  No abnormality visualized. ---------------------------------------------------------------------- Impression  Fetal growth restriction.  Increased risk for Down syndrome on cell free fetal DNA  screening.  Patient had opted not to have amniocentesis.  Patient has chronic hypertension and takes labetalol for  control.  Blood pressure today at her office is 127/83 mmHg  Fetal cardiac anomaly.  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  On today's ultrasound, the estimated fetal weight is at the 2nd  percentile.  Abdominal circumference  measurement is at the  10th percentile.  Amniotic fluid is normal and good fetal  activity seen.  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  Umbilical artery Doppler showed increased S/D ratio.  NST is  reactive for this gestational age.  I explained the finding of severe fetal growth restriction and  stable Doppler studies.  Timing of delivery will be based on  several factors including worsening of Doppler studies. ---------------------------------------------------------------------- Recommendations  -BPP, NST and UA Doppler next week.  -Antenatal corticosteroids if absent end-diastolic flow is seen  on UAD. ----------------------------------------------------------------------                  Noralee Space, MD Electronically Signed Final Report   09/24/2020 10:48 am ----------------------------------------------------------------------  [place] MFM UA CORD DOPPLER  Result Date: 09/17/2020 ----------------------------------------------------------------------  OBSTETRICS REPORT                       (Signed Final 09/17/2020 03:42 pm) ---------------------------------------------------------------------- Patient Info  ID #:       [id number]                          D.O.B.:  12/31/1990 (29 yrs)  Name:       Jerene Pitch                Visit Date: 09/17/2020 02:51 pm ---------------------------------------------------------------------- Performed By  Attending:        Lin Landsman      Secondary Phy.:   [facility]                    MD                                                             for Women  Performed By:     Eden Lathe BS      Address:          [address]  [address]                                                             [place], [place]                                                             [address]  Referred By:      Jerene Bears          Location:         [facility] ---------------------------------------------------------------------- Orders  #  Description                           Code        Ordered By  1  [place] MFM UA CORD DOPPLER                76820.02    RAVI SHANKAR  2  [place] MFM FETAL BPP                      34742.5     RAVI [facility]     W/NONSTRESS ----------------------------------------------------------------------  #  Order #                     Accession #                Episode #  1  [id number]                   [id number]                 [id number]  2  [id number]                   [id number]                 [id number] ---------------------------------------------------------------------- Indications  [redacted] weeks gestation of pregnancy  Z3A.29  Fetal Trisomy 21 affecting care of mother,     O47.1XX0  antepartum (per NIPS)  Fetal cardiac anomaly affecting pregnancy,     O35.8XX0  antepartum (known AV canal)  Hypertension - Chronic/Pre-                    O10.019  existing(Labetalol)  History of sickle cell trait                   Z86.2  Encounter for other antenatal screening        Z36.2  follow-up  Maternal care for known or suspected poor      O36.5930  fetal growth, third trimester, not applicable or  unspecified IUGR ---------------------------------------------------------------------- Fetal Evaluation  Num Of Fetuses:         1  Fetal Heart Rate(bpm):  137  Cardiac Activity:       Observed  Presentation:           Cephalic  Amniotic Fluid  AFI FV:      Within normal limits  AFI Sum(cm)     %Tile       Largest Pocket(cm)  22.8            93          6.  RUQ(cm)       RLQ(cm)       LUQ(cm)        LLQ(cm)  6             5.7           5.7            5.4 ---------------------------------------------------------------------- Biophysical Evaluation  Amniotic F.V:   Within normal limits       F. Tone:        Observed  F. Movement:     Observed                   N.S.T:          Nonreactive  F. Breathing:   Observed                   Score:          8/10 ---------------------------------------------------------------------- OB History  Gravidity:    1         Term:   0        Prem:   0        SAB:   0  TOP:          0       Ectopic:  0        Living: 0 ---------------------------------------------------------------------- Gestational Age  LMP:           29w 1d        Date:  02/26/20                 EDD:   12/02/20  Best:          29w 1d     Det. By:  LMP  (02/26/20)          EDD:   12/02/20 ---------------------------------------------------------------------- Doppler - Fetal Vessels  Umbilical Artery   S/D     %tile      RI    %tile      PI    %tile     PSV    ADFV    RDFV                                                     (  cm/s)   5.92   > 97.5    0.83   > 97.5    1.66   > 97.5    19.99      No      No ---------------------------------------------------------------------- Impression  Antenatal testing due to FGR with an EFW 5th% with an AC  < 18th% with elevated UA Dopplers.  Biophysical profile 8/8 with good fetal movement and  amniotic fluid volume  UA Dopplers are elevated with no evidence of AEDF or  REDF  BPP was 8/10 (-2 for NRNST)- the fetus becam very active  during the biophyiscal ---------------------------------------------------------------------- Recommendations  Continue weekly testing with UA Dopplers  Repeat growth in 1 week. ----------------------------------------------------------------------               Lin Landsman, MD Electronically Signed Final Report   09/17/2020 03:42 pm ----------------------------------------------------------------------   MAU Course/MDM: Orders Placed This Encounter  Procedures   Resp Panel by RT-PCR (Flu A&B, Covid) Nasopharyngeal Swab   CBC   RPR   Urinalysis, Routine w reflex microscopic Urine, Clean Catch   Diet NPO time specified   Type and screen [facility]    Insert peripheral IV    Meds ordered this encounter  Medications   lactated ringers bolus 1,000 mL   betamethasone acetate-betamethasone sodium phosphate (CELESTONE) injection 12 mg   labetalol (NORMODYNE) tablet 200 mg   lactated ringers infusion    NST reviewed; reactive with intermittent variable decels. Consult with MFM (Dr. Parke Poisson) prior to pt's arrival.  Dr. Vergie Living notified of pt's arrival at 1040; plan to discuss transfer out with pt given NICU census and fetal abnormalities. Treatments in MAU included: IVF, IM betamethasone, home dose of po labetalol 200mg  BID.  Assessment & Plan: Nalea Salce is a 30 y.o. G1P0000 at [redacted]w[redacted]d by L/5 who was sent to maternity admissions by MFM (Dr. Parke Poisson) given concern of severe IUGR with non-reactive NST in setting of chronic hypertension and fetus with increased risk of Down syndrome and complete balanced AV canal defect on fetal echo. Pt is GBS+ based on urine culture in pregnancy. NST in MAU reactive with intermittent variable decels. No evidence of preterm labor.  - Administered IVF and first dose of BMZ (1058 on 10/16/20).  - Given [facility] NICU currently at full capacity, Dr. Vergie Living coordinated direct transfer to [facility].  1. Intrauterine growth restriction (IUGR) affecting care of mother, third trimester, single gestation   2. Chronic hypertension   3. Abnormal fetal echocardiography affecting antepartum care of mother, single or unspecified fetus   4. Abnormal chromosomal and genetic finding on antenatal screening mother   5. Supervision of high risk pregnancy, antepartum   6. Chronic hypertension during pregnancy   7. Infertility, female     Allergies as of 10/16/2020   No Known Allergies      Medication List     STOP taking these medications    Proctofoam HC rectal foam Generic drug: hydrocortisone-pramoxine       TAKE these medications    aspirin EC 81 MG tablet Take 1 tablet (81 mg total) by mouth daily. Swallow  whole.   diphenhydrAMINE 25 MG tablet Commonly known as: BENADRYL Take 25 mg by mouth every 6 (six) hours as needed.   folic acid 1 MG tablet Commonly known as: FOLVITE Take 1 mg by mouth daily.   labetalol 200 MG tablet Commonly known as: NORMODYNE Take 1 tablet (200 mg total) by mouth 2 (two) times daily.  Lidocaine 4 % Gel Apply 1 application topically 3 (three) times daily as needed.   Prenatal 27-1 MG Tabs Take 1 tablet by mouth daily.   psyllium 58.6 % powder Commonly known as: METAMUCIL Take 1 packet by mouth 3 (three) times daily.       Sheila Oats, MD OB Fellow, Faculty Practice 10/16/2020 11:53 AM

## 2020-10-16 NOTE — MAU Provider Note (Addendum)
Maternity Admissions Unit Note  10/16/2020 - 10:55 AM Primary OBGYN: [facility]  Chief Complaint: non reassuring fetal testing with MFM  History of Present Illness  30 y.o. G1P0000 @ [redacted]w[redacted]d, with the above CC. 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 Her growth scan today showed efw<1%, 1481gm, ac<1%, elevated UA dopplers, AFI 10.5, cephalic  Given the persistent variables and 6/10 BPP, MFM (Dr. Parke Poisson) recommended steroid course and extended fetal monitoring with recommendation for delivery if variables persist.   [name] has no labor s/s.  Review of Systems: as noted in the History of Present Illness.  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   Supervision of high risk pregnancy, antepartum 05/26/2020   Chronic hypertension during pregnancy 05/26/2020   Infertility, female 05/26/2020   Hemorrhoids, external, thrombosed 01/20/2013    PMHx:  Past Medical History:  Diagnosis Date   Anal fissure    History of chlamydia    in high school   History of gonorrhea    in high school   Hypertension    Infertility, female    Rectal prolapse     PSHx:  Past Surgical History:  Procedure Laterality Date   MINOR HEMORRHOIDECTOMY  01/12/2013   I&D recurrent thrombosed hemorrhoid   Medications:  Medications Prior to Admission  Medication Sig Dispense Refill Last Dose   aspirin EC 81 MG tablet Take 1 tablet (81 mg total) by mouth daily. Swallow whole. 30 tablet 11    diphenhydrAMINE (BENADRYL) 25 MG tablet Take 25 mg by mouth every 6 (six) hours as needed.      folic acid (FOLVITE) 1 MG tablet Take 1 mg by mouth daily.      hydrocortisone-pramoxine (PROCTOFOAM HC) rectal foam PLACE 1 APPLICATOR RECTALLY 3 (THREE) TIMES DAILY AS NEEDED FOR HEMORRHOIDS OR ANAL ITCHING. (Patient not taking: Reported on 10/07/2020)      labetalol (NORMODYNE) 200 MG tablet Take 1 tablet (200 mg total) by mouth 2 (two) times daily. 60 tablet 2    Lidocaine 4 % GEL Apply 1 application topically 3 (three) times daily as needed. 30 g 0    Prenatal 27-1 MG TABS Take 1 tablet by mouth daily.      psyllium (METAMUCIL) 58.6 % powder Take 1 packet by mouth 3 (three) times daily.        Allergies: has No Known Allergies. OBHx:  OB History  Gravida Para Term Preterm AB Living  1 0 0 0 0 0  SAB IAB Ectopic Multiple Live Births  0 0 0 0 0    # Outcome Date GA Lbr Len/2nd Weight Sex Delivery Anes PTL Lv  1 Current  FHx:  Family History  Problem Relation Age of Onset   Diabetes Mother    Healthy Father    Soc Hx:  Social History   Socioeconomic History   Marital status: Married    Spouse name: Not on file   Number of children: Not on file   Years of education: Not on file   Highest education level: Not on file  Occupational History   Not on file  Tobacco Use   Smoking status: Never   Smokeless tobacco: Never  Vaping Use   Vaping Use: Never used  Substance and Sexual Activity   Alcohol use: No   Drug use: No   Sexual activity: Yes    Birth control/protection: None  Other Topics Concern   Not on file  Social History  Narrative   Not on file   Social Determinants of Health   Financial Resource Strain: Not on file  Food Insecurity: No Food Insecurity   Worried About Running Out of Food in the Last Year: Never true   Ran Out of Food in the Last Year: Never true  Transportation Needs: No Transportation Needs   Lack of Transportation (Medical): No   Lack of Transportation (Non-Medical): No  Physical Activity: Not on file  Stress: Not on file  Social Connections: Not on file  Intimate Partner Violence: Not on file    Objective    Current Vital Signs 24h Vital Sign Ranges  T 98.8 F (37.1 C) Temp  Avg: 98.8 F (37.1 C)  Min: 98.8 F (37.1 C)  Max: 98.8 F (37.1 C)  BP 131/90 BP  Min: 127/82  Max: 131/90  HR 75 Pulse  Avg: 76  Min: 75  Max: 77  RR 16 Resp  Avg: 16  Min: 16  Max: 16  SaO2 99 %   SpO2  Avg: 99 %  Min: 99 %  Max: 99 %       24 Hour I/O Current Shift I/O  Time Ins Outs No intake/output data recorded. No intake/output data recorded.   EFM: 140 baseline, +accel, +variables to the 70s (I was in the room and heard these at 1047), mod variability Toco: quiet  General: Well nourished, well developed female in no acute distress.  Skin:  Warm and dry.  Cardiovascular: S1, S2 normal, no murmur, rub or gallop, regular rate and rhythm Respiratory:  Clear to auscultation bilateral. Normal respiratory effort Abdomen: gravid, nttp Neuro/Psych:  Normal mood and affect.    Labs  Pending: covid, cbc, t&s, rpr  Radiology 6/17: efw<1%, 1481gm, ac<1%, elevated UA dopplers, AFI 10.5, cephalic 5/26: efw 1.9%, 1167gm, ac 10%, afi 12, , elevated UA dopplers, bpp 10/10  Assessment & Plan  30 y.o. G1 @ [redacted]w[redacted]d with persistent FGR; pt stable *Pregnancy: currently category I tracing *FGR: BMZ #1 given at 11am. Rpt at 24hrs or earlier PRN. Do Mg for fetal neuroprotection PRN. D/w pt to continue with NPO for now in case for delivery. *Preterm: unfortunately NICU is at max capacity and can not take  any admits. I told the patient that we are unsure if she will need to be delivered now or at a later time but given risk of situation decompensating and needing delivery, I recommend transfer to another hospital which she is amenable to. She has no preference; will try [facility] since she was getting her echos with them *T21 on cffdna: stable 6/9 peds cards echo showing complete AV canal defect; see care everywhere. No further echos  now. Rpt postpartum.  *CHTN: will give am dose of labetalol 200 bid.  *GBS: pos. Start abx PRN *Analgesia: no current needs *FEN/GI: NPO. IVF bolus and then MIVF *Dispo: see above.   Cornelia Copa MD Attending Center for [facility])

## 2020-10-16 NOTE — MAU Note (Signed)
Report given to Bonney Leitz, [place] Triage RN at [facility]. RN with no further questions.

## 2020-10-16 NOTE — MAU Note (Signed)
OB Note No answer with [facility] transfer line. Will try [facility] MD Attending Center for [employer] (Faculty Practice) 10/16/2020 Time: 1119am

## 2020-10-16 NOTE — MAU Note (Signed)
[facility] unable to transport patient due to no available trucks.

## 2020-10-16 NOTE — Progress Notes (Signed)
MFM Note  Michelle Garrison was seen for a follow up growth scan due to fetal growth restriction noted during her prior ultrasound exams.  Her pregnancy has been complicated by a positive cell free DNA test showing an increased risk of Down syndrome.  The fetus a complete balanced AV canal defect with a left-sided superior vena cava.  The pediatric cardiologist [facility] has stated that the patient may deliver here at [facility].  She denies any problems since her last exam and reports feeling vigorous fetal movements throughout the day.    On today's exam, the EFW measures at the less than the first percentile for her gestational age indicating severe fetal growth restriction.  The fetus has grown a little over half a pound over the past 3 weeks.  There was normal amniotic fluid noted.    A biophysical profile performed today due to fetal growth restriction was 6 out of 8.  She received a -2 for fetal breathing movements that did not meet criteria.  The patient subsequently had a nonreactive NST with mild variable decelerations, making her total biophysical profile score of 6 out of 10.    Doppler studies of the umbilical arteries showed an elevated S/D ratio of 5.18.  There were no signs of absent or reversed end-diastolic flow.    Due to severe fetal growth restriction and concerns regarding the fetal status, the patient was sent to the hospital following today's exam for admission, continuous monitoring, to receive a course of antenatal corticosteroids, and possibly delivery.   A total of 20 minutes was spent counseling and coordinating the care for this patient.  Greater than 50% of the time spent in direct face-to-face contact.

## 2020-10-18 ENCOUNTER — Other Ambulatory Visit: Payer: Self-pay

## 2020-10-18 ENCOUNTER — Inpatient Hospital Stay (HOSPITAL_COMMUNITY)
Admission: AD | Admit: 2020-10-18 | Discharge: 2020-10-18 | Disposition: A | Discharge status: Home / Self Care | Payer: 59 | Attending: Obstetrics & Gynecology | Admitting: Obstetrics & Gynecology

## 2020-10-18 ENCOUNTER — Encounter (HOSPITAL_COMMUNITY): Payer: Self-pay | Admitting: Obstetrics & Gynecology

## 2020-10-18 DIAGNOSIS — Z3A33 33 weeks gestation of pregnancy: Secondary | ICD-10-CM

## 2020-10-18 DIAGNOSIS — O368130 Decreased fetal movements, third trimester, not applicable or unspecified: Secondary | ICD-10-CM | POA: Diagnosis present

## 2020-10-18 DIAGNOSIS — Z8719 Personal history of other diseases of the digestive system: Secondary | ICD-10-CM | POA: Insufficient documentation

## 2020-10-18 DIAGNOSIS — Z7982 Long term (current) use of aspirin: Secondary | ICD-10-CM | POA: Insufficient documentation

## 2020-10-18 DIAGNOSIS — O365930 Maternal care for other known or suspected poor fetal growth, third trimester, not applicable or unspecified: Secondary | ICD-10-CM | POA: Diagnosis not present

## 2020-10-18 DIAGNOSIS — Z3689 Encounter for other specified antenatal screening: Secondary | ICD-10-CM | POA: Insufficient documentation

## 2020-10-18 DIAGNOSIS — Z79899 Other long term (current) drug therapy: Secondary | ICD-10-CM | POA: Diagnosis not present

## 2020-10-18 DIAGNOSIS — O368190 Decreased fetal movements, unspecified trimester, not applicable or unspecified: Secondary | ICD-10-CM

## 2020-10-18 LAB — URINALYSIS, ROUTINE W REFLEX MICROSCOPIC
Bilirubin Urine: NEGATIVE
Glucose, UA: 50 mg/dL — AB
Hgb urine dipstick: NEGATIVE
Ketones, ur: NEGATIVE mg/dL
Nitrite: NEGATIVE
Protein, ur: NEGATIVE mg/dL
Specific Gravity, Urine: 1.006 (ref 1.005–1.030)
pH: 6 (ref 5.0–8.0)

## 2020-10-18 NOTE — MAU Provider Note (Signed)
History     CSN: [id number]  Arrival date and time: 10/18/20 0506   Event Date/Time   First Provider Initiated Contact with Patient 10/18/20 0555      Chief Complaint  Patient presents with   Decreased Fetal Movement   HPI  Ms.Michelle Garrison is a 30 y.o. female G1P0000 @ G1P0000 here with decreased fetal movement. Pregnancy is complicated by IUGR, + panorama testing for T21, and complete AV canal defect observed on fetal echo. She has chronic hypertension and is currently taking labetalol 200 mg BID.  Upon arrival to MAU she reports feeling normal fetal movement. She has no other concerns and feels reassuured hearing the babies HR. She has an appointment in the office next week.    OB History     Gravida  1   Para  0   Term  0   Preterm  0   AB  0   Living  0      SAB  0   IAB  0   Ectopic  0   Multiple  0   Live Births  0           Past Medical History:  Diagnosis Date   Anal fissure    History of chlamydia    in high school   History of gonorrhea    in high school   Hypertension    Infertility, female    Rectal prolapse     Past Surgical History:  Procedure Laterality Date   MINOR HEMORRHOIDECTOMY  01/12/2013   I&D recurrent thrombosed hemorrhoid    Family History  Problem Relation Age of Onset   Diabetes Mother    Healthy Father     Social History   Tobacco Use   Smoking status: Never   Smokeless tobacco: Never  Vaping Use   Vaping Use: Never used  Substance Use Topics   Alcohol use: No   Drug use: No    Allergies: No Known Allergies  Medications Prior to Admission  Medication Sig Dispense Refill Last Dose   aspirin EC 81 MG tablet Take 1 tablet (81 mg total) by mouth daily. Swallow whole. 30 tablet 11    diphenhydrAMINE (BENADRYL) 25 MG tablet Take 25 mg by mouth every 6 (six) hours as needed.      folic acid (FOLVITE) 1 MG tablet Take 1 mg by mouth daily.      labetalol (NORMODYNE) 200 MG tablet Take 1 tablet (200 mg  total) by mouth 2 (two) times daily. 60 tablet 2    Lidocaine 4 % GEL Apply 1 application topically 3 (three) times daily as needed. 30 g 0    Prenatal 27-1 MG TABS Take 1 tablet by mouth daily.      psyllium (METAMUCIL) 58.6 % powder Take 1 packet by mouth 3 (three) times daily.      Results for orders placed or performed during the hospital encounter of 10/18/20 (from the past 48 hour(s))  Urinalysis, Routine w reflex microscopic Urine, Clean Catch     Status: Abnormal   Collection Time: 10/18/20  5:21 AM  Result Value Ref Range   Color, Urine STRAW (A) YELLOW   APPearance CLEAR CLEAR   Specific Gravity, Urine 1.006 1.005 - 1.030   pH 6.0 5.0 - 8.0   Glucose, UA 50 (A) NEGATIVE mg/dL   Hgb urine dipstick NEGATIVE NEGATIVE   Bilirubin Urine NEGATIVE NEGATIVE   Ketones, ur NEGATIVE NEGATIVE mg/dL   Protein, ur NEGATIVE NEGATIVE  mg/dL   Nitrite NEGATIVE NEGATIVE   Leukocytes,Ua LARGE (A) NEGATIVE   RBC / HPF 0-5 0 - 5 RBC/hpf   WBC, UA 0-5 0 - 5 WBC/hpf   Bacteria, UA RARE (A) NONE SEEN   Squamous Epithelial / LPF 0-5 0 - 5    Comment: Performed at [facility], [address] [place], [place] [address]    Review of Systems  Constitutional:  Negative for fever.  Eyes:  Negative for photophobia and visual disturbance.  Gastrointestinal:  Negative for abdominal pain.  Genitourinary:  Negative for dysuria.  Physical Exam   Blood pressure 127/83, pulse 83, temperature 97.9 F (36.6 C), temperature source Oral, resp. rate 18, height 5\' 6"  (1.676 m), weight 76.2 kg, last menstrual period 02/26/2020, SpO2 99 %.  Physical Exam Constitutional:      General: She is not in acute distress.    Appearance: Normal appearance. She is not ill-appearing, toxic-appearing or diaphoretic.  Abdominal:     Palpations: Abdomen is soft.  Skin:    General: Skin is warm.  Neurological:     Mental Status: She is alert and oriented to person, place, and time.  Psychiatric:        Behavior:  Behavior normal.   Fetal Tracing: Baseline: 125 bpm Variability: Moderate  Accelerations: 15x15 Decelerations: None Toco: Quiet   MAU Course  Procedures None  MDM  Reactive NST, patient now feeling fetal movements.   Assessment and Plan   A:  Decreased fetal movement during pregnancy, antepartum, single or unspecified fetus - Plan: Discharge patient  NST (non-stress test) reactive - Plan: Discharge patient  [redacted] weeks gestation of pregnancy - Plan: Discharge patient    P:  Discharge home in stable condition Fetal kick counts reviewed  Return to MAU if symptoms worsen  Georgana Romain, 02/28/2020, NP 10/19/2020 3:57 PM

## 2020-10-18 NOTE — MAU Note (Signed)
Patient reports last fetal movement was at 10pm last night. Reports some lower abdominal and back pain. Denies vaginal bleeding or leaking of fluid.  Pain score: 1/10

## 2020-10-22 ENCOUNTER — Ambulatory Visit: Payer: 59 | Attending: Obstetrics and Gynecology

## 2020-10-22 ENCOUNTER — Ambulatory Visit: Payer: 59 | Admitting: *Deleted

## 2020-10-22 ENCOUNTER — Other Ambulatory Visit: Payer: Self-pay

## 2020-10-22 ENCOUNTER — Encounter: Payer: Self-pay | Admitting: *Deleted

## 2020-10-22 ENCOUNTER — Other Ambulatory Visit: Payer: Self-pay | Admitting: *Deleted

## 2020-10-22 VITALS — BP 139/81 | HR 84

## 2020-10-22 DIAGNOSIS — O285 Abnormal chromosomal and genetic finding on antenatal screening of mother: Secondary | ICD-10-CM

## 2020-10-22 DIAGNOSIS — O365930 Maternal care for other known or suspected poor fetal growth, third trimester, not applicable or unspecified: Secondary | ICD-10-CM

## 2020-10-22 DIAGNOSIS — O0990 Supervision of high risk pregnancy, unspecified, unspecified trimester: Secondary | ICD-10-CM | POA: Insufficient documentation

## 2020-10-22 DIAGNOSIS — N979 Female infertility, unspecified: Secondary | ICD-10-CM

## 2020-10-22 DIAGNOSIS — O10919 Unspecified pre-existing hypertension complicating pregnancy, unspecified trimester: Secondary | ICD-10-CM | POA: Insufficient documentation

## 2020-10-22 DIAGNOSIS — O365990 Maternal care for other known or suspected poor fetal growth, unspecified trimester, not applicable or unspecified: Secondary | ICD-10-CM | POA: Diagnosis present

## 2020-10-22 DIAGNOSIS — O2610 Low weight gain in pregnancy, unspecified trimester: Secondary | ICD-10-CM

## 2020-10-22 MED ORDER — ENSURE NUTRITION SHAKE PO LIQD: ORAL | 5 refills | Status: DC

## 2020-10-22 NOTE — Procedures (Signed)
Smrithi Pigford 03/03/1991 [redacted]w[redacted]d  Fetus A Non-Stress Test Interpretation for 10/22/20  Indication: IUGR  Fetal Heart Rate A Mode: External Baseline Rate (A): 140 bpm Variability: Moderate Accelerations: None Decelerations: None Multiple birth?: No  Uterine Activity Mode: Palpation, Toco Contraction Frequency (min): none Resting Tone Palpated: Relaxed Resting Time: Adequate  Interpretation (Fetal Testing) Nonstress Test Interpretation: Non-reactive Overall Impression: Reassuring for gestational age Comments: Dr. Judeth Cornfield reviewed tracing.

## 2020-10-26 ENCOUNTER — Ambulatory Visit: Payer: 59 | Attending: Obstetrics and Gynecology | Admitting: *Deleted

## 2020-10-26 ENCOUNTER — Other Ambulatory Visit: Payer: Self-pay

## 2020-10-26 ENCOUNTER — Ambulatory Visit: Payer: 59 | Admitting: *Deleted

## 2020-10-26 ENCOUNTER — Ambulatory Visit (INDEPENDENT_AMBULATORY_CARE_PROVIDER_SITE_OTHER): Payer: 59 | Admitting: Family Medicine

## 2020-10-26 ENCOUNTER — Encounter: Payer: Self-pay | Admitting: *Deleted

## 2020-10-26 VITALS — BP 134/82 | HR 89

## 2020-10-26 VITALS — BP 122/77 | HR 84 | Wt 169.6 lb

## 2020-10-26 DIAGNOSIS — O10919 Unspecified pre-existing hypertension complicating pregnancy, unspecified trimester: Secondary | ICD-10-CM

## 2020-10-26 DIAGNOSIS — Z3A34 34 weeks gestation of pregnancy: Secondary | ICD-10-CM | POA: Insufficient documentation

## 2020-10-26 DIAGNOSIS — O35BXX0 Maternal care for other (suspected) fetal abnormality and damage, fetal cardiac anomalies, not applicable or unspecified: Secondary | ICD-10-CM

## 2020-10-26 DIAGNOSIS — O358XX0 Maternal care for other (suspected) fetal abnormality and damage, not applicable or unspecified: Secondary | ICD-10-CM | POA: Diagnosis present

## 2020-10-26 DIAGNOSIS — O285 Abnormal chromosomal and genetic finding on antenatal screening of mother: Secondary | ICD-10-CM

## 2020-10-26 DIAGNOSIS — N979 Female infertility, unspecified: Secondary | ICD-10-CM

## 2020-10-26 DIAGNOSIS — O0990 Supervision of high risk pregnancy, unspecified, unspecified trimester: Secondary | ICD-10-CM

## 2020-10-26 DIAGNOSIS — O365930 Maternal care for other known or suspected poor fetal growth, third trimester, not applicable or unspecified: Secondary | ICD-10-CM

## 2020-10-26 NOTE — Progress Notes (Signed)
   PRENATAL VISIT NOTE  Subjective:  Michelle Garrison is a 30 y.o. G1P0000 at [redacted]w[redacted]d being seen today for ongoing prenatal care.  She is currently monitored for the following issues for this high-risk pregnancy and has Supervision of high risk pregnancy, antepartum; Chronic hypertension during pregnancy; Infertility, female; History of trichomoniasis; Dysplasia of cervix, high grade CIN 2; Constipation, chronic; Hemorrhoids, external, thrombosed; Anal fissure; Abnormal chromosomal and genetic finding on antenatal screening mother; Sickle cell trait (HCC); Intrauterine growth restriction, antepartum, third trimester, not applicable or unspecified fetus; Abnormal fetal echocardiogram affecting antepartum care of mother; and Hemorrhoids during pregnancy in third trimester on their problem list.  Patient reports no complaints.  Contractions: Not present. Vag. Bleeding: None.  Movement: Present. Denies leaking of fluid.   The following portions of the patient's history were reviewed and updated as appropriate: allergies, current medications, past family history, past medical history, past social history, past surgical history and problem list.   Objective:   Vitals:   10/26/20 0824  BP: 122/77  Pulse: 84  Weight: 169 lb 9.6 oz (76.9 kg)    Fetal Status: Fetal Heart Rate (bpm): 143 Fundal Height: 32 cm Movement: Present     General:  Alert, oriented and cooperative. Patient is in no acute distress.  Skin: Skin is warm and dry. No rash noted.   Cardiovascular: Normal heart rate noted  Respiratory: Normal respiratory effort, no problems with respiration noted  Abdomen: Soft, gravid, appropriate for gestational age.  Pain/Pressure: Present     Pelvic: Cervical exam deferred        Extremities: Normal range of motion.  Edema: None  Mental Status: Normal mood and affect. Normal behavior. Normal judgment and thought content.   Assessment and Plan:  Pregnancy: G1P0000 at [redacted]w[redacted]d 1. Supervision of  high risk pregnancy, antepartum Continue routine prenatal care. Next visit needs cultures  2. Chronic hypertension during pregnancy On labetalol and ASA Good BP control today  3. Abnormal fetal echocardiography affecting antepartum care of mother, single or unspecified fetus AV canal defect  4. Abnormal chromosomal and genetic finding on antenatal screening mother T21   5. Intrauterine growth restriction, antepartum, third trimester, not applicable or unspecified fetus In 2x/wk testing with MFM and weekly dopplers    Preterm labor symptoms and general obstetric precautions including but not limited to vaginal bleeding, contractions, leaking of fluid and fetal movement were reviewed in detail with the patient. Please refer to After Visit Summary for other counseling recommendations.   Return in 2 weeks (on 11/09/2020).  Future Appointments  Date Time Provider Department Center  10/26/2020  9:30 AM [facility] NURSE [facility]  10/26/2020  9:45 AM [facility] NST [facility]  10/30/2020  7:30 AM [facility] NURSE [facility]  10/30/2020  7:45 AM [facility] US5 [facility]  10/30/2020  8:45 AM [facility] NST [facility]  11/03/2020  8:30 AM [facility] NURSE [facility]  11/03/2020  8:45 AM [facility] NST [facility]  11/06/2020  7:15 AM [facility] NURSE [facility]  11/06/2020  7:30 AM [facility] US3 [facility]  11/06/2020  9:45 AM [facility] NST [facility]  11/10/2020  8:30 AM [facility] NURSE [facility]  11/10/2020  8:45 AM [facility] NST [facility]    Reva Bores, MD

## 2020-10-26 NOTE — Procedures (Signed)
Michelle Garrison 12/18/1990 106w5d  Fetus A Non-Stress Test Interpretation for 10/26/20  Indication:  Fetal CHD, T21  Fetal Heart Rate A Mode: External Baseline Rate (A): 140 bpm Variability: Moderate Accelerations: 10 x 10 Decelerations: None  Uterine Activity Mode: Palpation, Toco Contraction Frequency (min): Occas UI Contraction Quality: Mild Resting Tone Palpated: Relaxed Resting Time: Adequate  Interpretation (Fetal Testing) Nonstress Test Interpretation: Non-reactive Overall Impression: Reassuring for gestational age Comments: Fetus active. Dr. Judeth Cornfield reviewed tracing.

## 2020-10-30 ENCOUNTER — Ambulatory Visit (HOSPITAL_BASED_OUTPATIENT_CLINIC_OR_DEPARTMENT_OTHER): Payer: 59

## 2020-10-30 ENCOUNTER — Inpatient Hospital Stay (HOSPITAL_COMMUNITY)
Admission: AD | Admit: 2020-10-30 | Discharge: 2020-11-01 | DRG: 806 | Disposition: A | Discharge status: Home / Self Care | Payer: 59 | Attending: Family Medicine | Admitting: Family Medicine

## 2020-10-30 ENCOUNTER — Encounter (HOSPITAL_COMMUNITY): Payer: Self-pay | Admitting: Obstetrics and Gynecology

## 2020-10-30 ENCOUNTER — Inpatient Hospital Stay (HOSPITAL_BASED_OUTPATIENT_CLINIC_OR_DEPARTMENT_OTHER): Payer: 59 | Admitting: Obstetrics

## 2020-10-30 ENCOUNTER — Other Ambulatory Visit: Payer: Self-pay

## 2020-10-30 ENCOUNTER — Ambulatory Visit: Payer: 59 | Admitting: *Deleted

## 2020-10-30 ENCOUNTER — Other Ambulatory Visit: Payer: Self-pay | Admitting: Maternal & Fetal Medicine

## 2020-10-30 ENCOUNTER — Inpatient Hospital Stay (HOSPITAL_COMMUNITY): Payer: 59 | Admitting: Anesthesiology

## 2020-10-30 VITALS — BP 137/86 | HR 88

## 2020-10-30 DIAGNOSIS — O99824 Streptococcus B carrier state complicating childbirth: Secondary | ICD-10-CM | POA: Diagnosis present

## 2020-10-30 DIAGNOSIS — O365990 Maternal care for other known or suspected poor fetal growth, unspecified trimester, not applicable or unspecified: Secondary | ICD-10-CM | POA: Diagnosis present

## 2020-10-30 DIAGNOSIS — Z3A35 35 weeks gestation of pregnancy: Secondary | ICD-10-CM | POA: Diagnosis not present

## 2020-10-30 DIAGNOSIS — O285 Abnormal chromosomal and genetic finding on antenatal screening of mother: Secondary | ICD-10-CM | POA: Insufficient documentation

## 2020-10-30 DIAGNOSIS — O365930 Maternal care for other known or suspected poor fetal growth, third trimester, not applicable or unspecified: Secondary | ICD-10-CM

## 2020-10-30 DIAGNOSIS — O358XX0 Maternal care for other (suspected) fetal abnormality and damage, not applicable or unspecified: Secondary | ICD-10-CM | POA: Diagnosis present

## 2020-10-30 DIAGNOSIS — O10013 Pre-existing essential hypertension complicating pregnancy, third trimester: Secondary | ICD-10-CM

## 2020-10-30 DIAGNOSIS — O99820 Streptococcus B carrier state complicating pregnancy: Secondary | ICD-10-CM | POA: Diagnosis not present

## 2020-10-30 DIAGNOSIS — O35BXX0 Maternal care for other (suspected) fetal abnormality and damage, fetal cardiac anomalies, not applicable or unspecified: Secondary | ICD-10-CM | POA: Diagnosis present

## 2020-10-30 DIAGNOSIS — N871 Moderate cervical dysplasia: Secondary | ICD-10-CM | POA: Diagnosis present

## 2020-10-30 DIAGNOSIS — O119 Pre-existing hypertension with pre-eclampsia, unspecified trimester: Secondary | ICD-10-CM | POA: Diagnosis not present

## 2020-10-30 DIAGNOSIS — N979 Female infertility, unspecified: Secondary | ICD-10-CM

## 2020-10-30 DIAGNOSIS — D573 Sickle-cell trait: Secondary | ICD-10-CM | POA: Diagnosis present

## 2020-10-30 DIAGNOSIS — O10919 Unspecified pre-existing hypertension complicating pregnancy, unspecified trimester: Secondary | ICD-10-CM

## 2020-10-30 DIAGNOSIS — O351XX0 Maternal care for (suspected) chromosomal abnormality in fetus, not applicable or unspecified: Secondary | ICD-10-CM

## 2020-10-30 DIAGNOSIS — O0990 Supervision of high risk pregnancy, unspecified, unspecified trimester: Secondary | ICD-10-CM | POA: Insufficient documentation

## 2020-10-30 DIAGNOSIS — Z7982 Long term (current) use of aspirin: Secondary | ICD-10-CM | POA: Diagnosis not present

## 2020-10-30 DIAGNOSIS — Z20822 Contact with and (suspected) exposure to covid-19: Secondary | ICD-10-CM | POA: Diagnosis present

## 2020-10-30 DIAGNOSIS — Z8619 Personal history of other infectious and parasitic diseases: Secondary | ICD-10-CM | POA: Diagnosis present

## 2020-10-30 DIAGNOSIS — Z862 Personal history of diseases of the blood and blood-forming organs and certain disorders involving the immune mechanism: Secondary | ICD-10-CM

## 2020-10-30 DIAGNOSIS — O3513X0 Maternal care for (suspected) chromosomal abnormality in fetus, trisomy 21, not applicable or unspecified: Secondary | ICD-10-CM

## 2020-10-30 DIAGNOSIS — O2243 Hemorrhoids in pregnancy, third trimester: Secondary | ICD-10-CM | POA: Diagnosis present

## 2020-10-30 DIAGNOSIS — O9902 Anemia complicating childbirth: Secondary | ICD-10-CM | POA: Diagnosis present

## 2020-10-30 DIAGNOSIS — O1002 Pre-existing essential hypertension complicating childbirth: Secondary | ICD-10-CM | POA: Diagnosis present

## 2020-10-30 DIAGNOSIS — O365931 Maternal care for other known or suspected poor fetal growth, third trimester, fetus 1: Secondary | ICD-10-CM

## 2020-10-30 DIAGNOSIS — O368330 Maternal care for abnormalities of the fetal heart rate or rhythm, third trimester, not applicable or unspecified: Secondary | ICD-10-CM | POA: Diagnosis not present

## 2020-10-30 LAB — COMPREHENSIVE METABOLIC PANEL
ALT: 15 U/L (ref 0–44)
AST: 17 U/L (ref 15–41)
Albumin: 3.3 g/dL — ABNORMAL LOW (ref 3.5–5.0)
Alkaline Phosphatase: 68 U/L (ref 38–126)
Anion gap: 11 (ref 5–15)
BUN: 10 mg/dL (ref 6–20)
CO2: 19 mmol/L — ABNORMAL LOW (ref 22–32)
Calcium: 9.8 mg/dL (ref 8.9–10.3)
Chloride: 104 mmol/L (ref 98–111)
Creatinine, Ser: 0.74 mg/dL (ref 0.44–1.00)
GFR, Estimated: >60 mL/min (ref >60)
Glucose, Bld: 96 mg/dL (ref 70–99)
Potassium: 4 mmol/L (ref 3.5–5.1)
Sodium: 134 mmol/L — ABNORMAL LOW (ref 135–145)
Total Bilirubin: 0.6 mg/dL (ref 0.3–1.2)
Total Protein: 7.6 g/dL (ref 6.5–8.1)

## 2020-10-30 LAB — RESP PANEL BY RT-PCR (FLU A&B, COVID) ARPGX2
Influenza A by PCR: NEGATIVE
Influenza B by PCR: NEGATIVE
SARS Coronavirus 2 by RT PCR: NEGATIVE

## 2020-10-30 LAB — PROTEIN / CREATININE RATIO, URINE
Creatinine, Urine: 67.83 mg/dL
Total Protein, Urine: <6 mg/dL

## 2020-10-30 LAB — CBC
HCT: 38.3 % (ref 36.0–46.0)
Hemoglobin: 12.9 g/dL (ref 12.0–15.0)
MCH: 32.7 pg (ref 26.0–34.0)
MCHC: 33.7 g/dL (ref 30.0–36.0)
MCV: 97.2 fL (ref 80.0–100.0)
Platelets: 343 10*3/uL (ref 150–400)
RBC: 3.94 MIL/uL (ref 3.87–5.11)
RDW: 14.1 % (ref 11.5–15.5)
WBC: 9 10*3/uL (ref 4.0–10.5)
nRBC: 0 % (ref 0.0–0.2)

## 2020-10-30 LAB — TYPE AND SCREEN
ABO/RH(D): B POS
Antibody Screen: NEGATIVE

## 2020-10-30 MED ORDER — OXYTOCIN BOLUS FROM INFUSION
333.0000 mL | Freq: Once | INTRAVENOUS | Status: AC
  Administered 2020-10-31: 333 mL via INTRAVENOUS

## 2020-10-30 MED ORDER — ONDANSETRON HCL 4 MG/2ML IJ SOLN: 4.0000 mg | Freq: Four times a day (QID) | INTRAMUSCULAR | Status: DC | PRN

## 2020-10-30 MED ORDER — ACETAMINOPHEN 325 MG PO TABS: 650.0000 mg | ORAL_TABLET | ORAL | Status: DC | PRN

## 2020-10-30 MED ORDER — LACTATED RINGERS IV SOLN
500.0000 mL | INTRAVENOUS | Status: DC | PRN
  Administered 2020-10-31: 500 mL via INTRAVENOUS

## 2020-10-30 MED ORDER — OXYTOCIN-SODIUM CHLORIDE 30-0.9 UT/500ML-% IV SOLN
1.0000 m[IU]/min | INTRAVENOUS | Status: DC
Start: 2020-10-30 — End: 2020-10-31
  Administered 2020-10-30: 4 m[IU]/min via INTRAVENOUS

## 2020-10-30 MED ORDER — OXYCODONE-ACETAMINOPHEN 5-325 MG PO TABS
2.0000 | ORAL_TABLET | ORAL | Status: DC | PRN
Start: 2020-10-30 — End: 2020-10-31

## 2020-10-30 MED ORDER — SODIUM CHLORIDE 0.9 % IV SOLN
5.0000 10*6.[IU] | Freq: Once | INTRAVENOUS | Status: AC
  Administered 2020-10-30: 5 10*6.[IU] via INTRAVENOUS
  Filled 2020-10-30: qty 5

## 2020-10-30 MED ORDER — TERBUTALINE SULFATE 1 MG/ML IJ SOLN: 0.2500 mg | Freq: Once | INTRAMUSCULAR | Status: DC | PRN

## 2020-10-30 MED ORDER — LIDOCAINE HCL (PF) 1 % IJ SOLN: 30.0000 mL | INTRAMUSCULAR | Status: DC | PRN

## 2020-10-30 MED ORDER — SOD CITRATE-CITRIC ACID 500-334 MG/5ML PO SOLN: 30.0000 mL | ORAL | Status: DC | PRN

## 2020-10-30 MED ORDER — OXYCODONE-ACETAMINOPHEN 5-325 MG PO TABS
1.0000 | ORAL_TABLET | ORAL | Status: DC | PRN
Start: 2020-10-30 — End: 2020-10-31

## 2020-10-30 MED ORDER — OXYTOCIN-SODIUM CHLORIDE 30-0.9 UT/500ML-% IV SOLN: 1.0000 m[IU]/min | INTRAVENOUS | Status: DC

## 2020-10-30 MED ORDER — FENTANYL CITRATE (PF) 100 MCG/2ML IJ SOLN: 50.0000 ug | INTRAMUSCULAR | Status: DC | PRN

## 2020-10-30 MED ORDER — LIDOCAINE HCL (PF) 1 % IJ SOLN
INTRAMUSCULAR | Status: DC | PRN
  Administered 2020-10-30 (×2): 3 mL via EPIDURAL

## 2020-10-30 MED ORDER — EPHEDRINE 5 MG/ML INJ: 10.0000 mg | INTRAVENOUS | Status: DC | PRN

## 2020-10-30 MED ORDER — OXYTOCIN-SODIUM CHLORIDE 30-0.9 UT/500ML-% IV SOLN
2.5000 [IU]/h | INTRAVENOUS | Status: DC
  Administered 2020-10-31: 2.5 [IU]/h via INTRAVENOUS
  Filled 2020-10-30: qty 500

## 2020-10-30 MED ORDER — DIPHENHYDRAMINE HCL 50 MG/ML IJ SOLN: 12.5000 mg | INTRAMUSCULAR | Status: DC | PRN

## 2020-10-30 MED ORDER — PENICILLIN G POT IN DEXTROSE 60000 UNIT/ML IV SOLN
3.0000 10*6.[IU] | INTRAVENOUS | Status: DC
  Administered 2020-10-30 (×2): 3 10*6.[IU] via INTRAVENOUS
  Filled 2020-10-30 (×2): qty 50

## 2020-10-30 MED ORDER — FENTANYL-BUPIVACAINE-NACL 0.5-0.125-0.9 MG/250ML-% EP SOLN
12.0000 mL/h | EPIDURAL | Status: DC | PRN
  Administered 2020-10-30: 12 mL/h via EPIDURAL
  Filled 2020-10-30: qty 250

## 2020-10-30 MED ORDER — LACTATED RINGERS IV SOLN: INTRAVENOUS | Status: DC

## 2020-10-30 MED ORDER — PHENYLEPHRINE 40 MCG/ML (10ML) SYRINGE FOR IV PUSH (FOR BLOOD PRESSURE SUPPORT)
80.0000 ug | PREFILLED_SYRINGE | INTRAVENOUS | Status: DC | PRN
  Filled 2020-10-30: qty 10

## 2020-10-30 MED ORDER — LABETALOL HCL 200 MG PO TABS
200.0000 mg | ORAL_TABLET | Freq: Two times a day (BID) | ORAL | Status: DC
  Administered 2020-10-30: 200 mg via ORAL
  Filled 2020-10-30: qty 1

## 2020-10-30 MED ORDER — PHENYLEPHRINE 40 MCG/ML (10ML) SYRINGE FOR IV PUSH (FOR BLOOD PRESSURE SUPPORT)
80.0000 ug | PREFILLED_SYRINGE | INTRAVENOUS | Status: DC | PRN
  Administered 2020-10-30: 80 ug via INTRAVENOUS

## 2020-10-30 MED ORDER — LACTATED RINGERS IV SOLN
500.0000 mL | Freq: Once | INTRAVENOUS | Status: AC
Start: 2020-10-30 — End: 2020-10-30
  Administered 2020-10-30: 500 mL via INTRAVENOUS

## 2020-10-30 NOTE — MAU Note (Signed)
Denies pain, bleeding or LOF. Reports +FM.

## 2020-10-30 NOTE — Anesthesia Preprocedure Evaluation (Signed)
Anesthesia Evaluation  Patient identified by MRN, date of birth, ID band Patient awake    Reviewed: Allergy & Precautions, Patient's Chart, lab work & pertinent test results, reviewed documented beta blocker date and time   Airway Mallampati: II  TM Distance: >3 FB Neck ROM: Full    Dental  (+) Dental Advisory Given, Teeth Intact   Pulmonary neg pulmonary ROS,    Pulmonary exam normal breath sounds clear to auscultation       Cardiovascular hypertension, Pt. on medications and Pt. on home beta blockers Normal cardiovascular exam Rhythm:Regular Rate:Normal     Neuro/Psych negative neurological ROS     GI/Hepatic negative GI ROS, Neg liver ROS,   Endo/Other  negative endocrine ROS  Renal/GU negative Renal ROS     Musculoskeletal negative musculoskeletal ROS (+)   Abdominal   Peds  Hematology negative hematology ROS (+)   Anesthesia Other Findings   Reproductive/Obstetrics (+) Pregnancy                             Anesthesia Physical Anesthesia Plan  ASA: 2  Anesthesia Plan: Epidural   Post-op Pain Management:    Induction:   PONV Risk Score and Plan:   Airway Management Planned:   Additional Equipment:   Intra-op Plan:   Post-operative Plan:   Informed Consent: I have reviewed the patients History and Physical, chart, labs and discussed the procedure including the risks, benefits and alternatives for the proposed anesthesia with the patient or authorized representative who has indicated his/her understanding and acceptance.       Plan Discussed with:   Anesthesia Plan Comments:         Anesthesia Quick Evaluation

## 2020-10-30 NOTE — H&P (Signed)
Obstetrics H&P   10/30/2020 - 10:48 AM Primary OBGYN: [facility]  Chief Complaint: non reassuring fetal testing with MFM  History of Present Illness  30 y.o. G1P0000 @ [redacted]w[redacted]d, with the above CC. 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 Parke Poisson recommended delivery today. EFW still 1% 4lbs 3oz and still with elevated UA dopplers  [name] has no labor s/s. Patient ate breakfast at 0500 and had some water at 0800  Review of Systems: as noted in the History of Present Illness.  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   Supervision of high risk pregnancy, antepartum 05/26/2020   Chronic hypertension during pregnancy 05/26/2020   Infertility, female 05/26/2020   Hemorrhoids, external, thrombosed 01/20/2013    PMHx:  Past Medical History:  Diagnosis Date   Anal fissure    History of chlamydia    in high school   History of gonorrhea    in high school   Hypertension    Infertility, female    Rectal prolapse    PSHx:  Past Surgical History:  Procedure Laterality Date   MINOR HEMORRHOIDECTOMY  01/12/2013   I&D recurrent thrombosed hemorrhoid    Medications:  Medications Prior to Admission  Medication Sig Dispense Refill Last Dose   aspirin EC 81 MG tablet Take 1 tablet (81 mg total) by mouth daily. Swallow whole. 30 tablet 11    diphenhydrAMINE (BENADRYL) 25 MG tablet Take 25 mg by mouth every 6 (six) hours as needed. (Patient not taking: Reported on 10/26/2020)      folic acid (FOLVITE) 1 MG tablet Take 1 mg by mouth daily.      labetalol (NORMODYNE) 200 MG tablet Take 1 tablet (200 mg total) by mouth 2 (two) times daily. 60 tablet 2    Lidocaine 4 % GEL Apply 1 application topically 3 (three) times daily as needed. (Patient not taking: Reported on 10/26/2020) 30 g 0    Nutritional Supplements (ENSURE NUTRITION SHAKE) LIQD 1 bottle between meals twice a day. 237 mL 5    Prenatal 27-1 MG TABS Take 1 tablet by mouth daily.      psyllium (METAMUCIL) 58.6 % powder Take 1 packet by mouth 3 (three) times daily.        Allergies: has No Known Allergies. OBHx:  OB History  Gravida Para Term Preterm AB Living  1 0 0 0 0 0  SAB IAB Ectopic Multiple Live Births  0 0 0 0 0    # Outcome Date GA Lbr Len/2nd Weight Sex Delivery Anes PTL Lv  1 Current                     FHx:  Family History  Problem Relation  Age of Onset   Diabetes Mother    Healthy Father    Soc Hx:  Social History   Socioeconomic History   Marital status: Married    Spouse name: Not on file   Number of children: Not on file   Years of education: Not on file   Highest education level: Not on file  Occupational History   Not on file  Tobacco Use   Smoking status: Never   Smokeless tobacco: Never  Vaping Use   Vaping Use: Never used  Substance and Sexual Activity   Alcohol use: No   Drug use: No   Sexual activity: Yes    Birth control/protection: None  Other Topics Concern   Not on file  Social History Narrative   Not on file   Social Determinants of Health   Financial Resource Strain: Not on file  Food Insecurity: No Food Insecurity    Worried About Running Out of Food in the Last Year: Never true   Ran Out of Food in the Last Year: Never true  Transportation Needs: No Transportation Needs   Lack of Transportation (Medical): No   Lack of Transportation (Non-Medical): No  Physical Activity: Not on file  Stress: Not on file  Social Connections: Not on file  Intimate Partner Violence: Not on file    Objective    Current Vital Signs 24h Vital Sign Ranges  T 98.6 F (37 C) Temp  Avg: 98.6 F (37 C)  Min: 98.6 F (37 C)  Max: 98.6 F (37 C)  BP (!) 131/93 ("very anxious, first baby") BP  Min: 131/93  Max: 137/86  HR (!) 103 Pulse  Avg: 95.5  Min: 88  Max: 103  RR 18 Resp  Avg: 18  Min: 18  Max: 18  SaO2 99 %   SpO2  Avg: 99 %  Min: 99 %  Max: 99 %       24 Hour I/O Current Shift I/O  Time Ins Outs No intake/output data recorded. No intake/output data recorded.   EFM: pending  General: Well nourished, well developed female in no acute distress.  Skin:  Warm and dry.  Cardiovascular: S1, S2 normal, no murmur, rub or gallop, regular rate and rhythm Respiratory:  Clear to auscultation bilateral. Normal respiratory effort Abdomen: gravid, nttp Neuro/Psych:  Normal mood and affect.    Labs  Pending: covid, cbc, t&s, rpr  Radiology As per HPI 6/17: efw<1%, 1481gm, ac<1%, elevated UA dopplers, AFI 10.5, cephalic 5/26: efw 1.9%, 1167gm, ac 10%, afi 12, , elevated UA dopplers, bpp 10/10  Assessment & Plan  30 y.o. G1 @ [redacted]w[redacted]d with persistent FGR; pt stable *Pregnancy: see below *FGR: I told her I recommend a CST first and if fails then recommend c/s, which she is amenable to. Pt to stay NPO. If for IOL, then recommend foley bulb and avoiding cytotec if at all possible *Preterm: NICU aware. S/p bmz on 6/17 and 6/18 *T21 on cffdna: stable 6/9 peds cards echo showing complete AV canal defect; see care everywhere. No further echos now. Rpt postpartum.  *CHTN: continue labetalol 200mg  bid on admission.  *GBS: pos.  Start abx if passes CST *Analgesia: no current needs *Hemorrhoids: pt states they are much improved and smaller and she is okay with pushing with delivery *FEN/GI: NPO. MIVF  MD Attending [facility])

## 2020-10-30 NOTE — Progress Notes (Signed)
Michelle Garrison is a 30 y.o. G1P0000 at [redacted]w[redacted]d by LMP admitted for induction of labor due to severe FGR and abnormal dopplers with EFW 1.3%tile, T21 on NIPS with AV canal defect.   Subjective: Pt comfortable with epidural. Family in room for support.  Objective: BP 122/80   Pulse 83   Temp 98.2 F (36.8 C) (Oral)   Resp 16   Ht 5\' 6"  (1.676 m)   Wt 76.5 kg   LMP 02/26/2020   SpO2 98%   BMI 27.23 kg/m  No intake/output data recorded. No intake/output data recorded.  FHT:  FHR: 135 bpm, variability: moderate,  accelerations:  Abscent,  decelerations:  Absent UC:   regular, every 2 minutes SVE:   Dilation: 5 Effacement (%): 70 Station: -2 Exam by:: [email or web address], CNM Cook catheter, both inflated balloons found to be through the cervix so deflated and removed by RN prior to CNM exam.  Labs: Lab Results  Component Value Date   WBC 9.0 10/30/2020   HGB 12.9 10/30/2020   HCT 38.3 10/30/2020   MCV 97.2 10/30/2020   PLT 343 10/30/2020    Assessment / Plan: Induction of labor due to FGR S/P Foley balloon  Labor:  With foley balloon out, Pitocin order changed from low dose to increase by 2 per protocol.  Consider AROM and IUPC at next exam.  Preeclampsia:   n/a Fetal Wellbeing:  Category I Pain Control:  Epidural I/D:   GBS positive on PCN Anticipated MOD:   SVD  12/31/2020 10/30/2020, 9:27 PM

## 2020-10-30 NOTE — Anesthesia Procedure Notes (Signed)
Epidural Patient location during procedure: OB Start time: 10/30/2020 5:31 PM End time: 10/30/2020 5:46 PM  Staffing Anesthesiologist: Lewie Loron, MD Performed: anesthesiologist   Preanesthetic Checklist Completed: patient identified, IV checked, risks and benefits discussed, monitors and equipment checked, pre-op evaluation and timeout performed  Epidural Patient position: sitting Prep: DuraPrep and site prepped and draped Patient monitoring: heart rate, continuous pulse ox and blood pressure Approach: midline Location: L2-L3 Injection technique: LOR air and LOR saline  Needle:  Needle type: Tuohy  Needle gauge: 17 G Needle length: 9 cm Needle insertion depth: 5 cm Catheter type: closed end flexible Catheter size: 19 Gauge Catheter at skin depth: 10 cm Test dose: negative  Assessment Sensory level: T8 Events: blood not aspirated, injection not painful, no injection resistance, no paresthesia and negative IV test  Additional Notes Reason for block:procedure for pain

## 2020-10-30 NOTE — Procedures (Signed)
Michelle Garrison [birth year] [redacted]w[redacted]d  Fetus A Non-Stress Test Interpretation for 10/30/20  Indication: IUGR, BPP 6/8  Fetal Heart Rate A Mode: External Baseline Rate (A): 140 bpm Variability: Moderate Accelerations: None Decelerations: None Multiple birth?: No  Uterine Activity Mode: Palpation, Toco Contraction Frequency (min): 3 ucs Contraction Duration (sec): 50-60 Contraction Quality: Mild Resting Tone Palpated: Relaxed Resting Time: Adequate  Interpretation (Fetal Testing) Nonstress Test Interpretation: Non-reactive Overall Impression: Reassuring for gestational age Comments: Dr. Parke Poisson reviewed tracing. Patient sent to [facility] for direct admit for induction of labor.

## 2020-10-30 NOTE — Progress Notes (Addendum)
Labor Progress Note Michelle Garrison is a 30 y.o. G1P0000 at [redacted]w[redacted]d presented for IOL -severe IUGR (>1%), BPP 6/10, elevated dopplers.  S: Patient is resting with some discomfort.  O:  BP 130/84   Pulse 90   Temp 98.2 F (36.8 C) (Oral)   Resp 16   Ht 5\' 6"  (1.676 m)   Wt 76.5 kg   LMP 02/26/2020   SpO2 99%   BMI 27.23 kg/m  EFM: baseline 130 BPM/moderate variability/+accels/-decels  Toco: q1-3 min  CVE: Dilation: 1 Effacement (%): 50 Station: -2 Presentation: Vertex Exam by:: Dr. [email or web address]   A&P: 30 y.o. G1P0000 [redacted]w[redacted]d IOL -severe IUGR (>1%), BPP 6/10, elevated dopplers.  #Labor: Pt is contracting intermittently, CC80/80 still in place. Pitocin at 77ml/hr, will continue to monitor.  #Pain: PRN, discussed epidural is an option if she wants it   #FWB: cat 1 #GBS positive urine, PCN #cHTN-asymptomatic, preE labs nml. Cont home labetolol 200 BID. Cont to monitor.     11m, MD [facility], [facility] 5:23 PM  GME ATTESTATION:  I saw and evaluated the patient. I agree with the findings and the plan of care as documented in the resident's note.  [facility], MD OB Fellow, Faculty [facility], Center for [facility] 10/30/2020 5:46 PM

## 2020-10-30 NOTE — Progress Notes (Signed)
Labor Progress Note Michelle Garrison is a 30 y.o. G1P0000 at [redacted]w[redacted]d presented for IOL -severe IUGR (>1%), BPP 6/10, elevated dopplers.  S: Patient is resting comfortably.  O:  BP 132/88   Pulse 86   Temp 98.1 F (36.7 C) (Oral)   Resp 16   Ht 5\' 6"  (1.676 m)   Wt 76.5 kg   LMP 02/26/2020   SpO2 99%   BMI 27.23 kg/m  EFM: baseline 145 BPM/moderate variability/- accels/-decels Toco: q1-3 minutes   CVE: Dilation: 1 Effacement (%): 50 Station: -2 Presentation: Vertex Exam by:: Dr. [email or web address]   A&P: 30 y.o. G1P0000 [redacted]w[redacted]d presented for IOL -severe IUGR (>1%), BPP 6/10, elevated dopplers.  #IOL: Pt presented for the above and had negative CST, so decision made to start IOL at that time. CC80/80 placed without difficulty. Will hold pitocin at 6 ml per hour until CC dislodged and then cont to titrate.  #Pain: prn #FWB: cat 1 #GBS positive urine, PCN  cHTN-asymptomatic, CBC and CMP unremarkable, P/C pending. Cont home labetolol 200 BID. Cont to monitor.   [redacted]w[redacted]d, MD, PGY1 [facility], [facility] 1:38 PM

## 2020-10-30 NOTE — Progress Notes (Signed)
MFM Note  Michelle Garrison was seen for a follow up growth scan due to fetal growth restriction noted during her prior ultrasound exams.  The fetus has a known AV canal defect and an increased risk for trisomy 21 noted on her cell free DNA test.  She already received a complete course of antenatal corticosteroids about 2 weeks ago.    On today's exam, the EFW measures at the 1st percentile for her gestational age indicating fetal growth restriction.  The EFW obtained today is 4 pounds 3 ounces.  There was normal amniotic fluid noted.    Doppler studies of the umbilical arteries showed an elevated S/D ratio of 4.42.  There were no signs of absent or reversed end-diastolic flow.    A biophysical profile performed today was 6 out of 10.  The fetus showed absent fetal breathing movements and had a nonreactive NST.  Due to severe fetal growth restriction and a biophysical profile score of 6 out of 10 along with a fetus with a congenital heart defect, delivery is recommended today.  The patient was sent to the hospital for delivery following today's ultrasound exam.    The patient stated that all of her questions have been answered to her complete satisfaction.    A total of 15 minutes was spent counseling and coordinating the care for this patient.  Greater than 50% of the time was spent in direct face-to-face contact.

## 2020-10-31 DIAGNOSIS — O368330 Maternal care for abnormalities of the fetal heart rate or rhythm, third trimester, not applicable or unspecified: Secondary | ICD-10-CM

## 2020-10-31 DIAGNOSIS — O99820 Streptococcus B carrier state complicating pregnancy: Secondary | ICD-10-CM

## 2020-10-31 DIAGNOSIS — O119 Pre-existing hypertension with pre-eclampsia, unspecified trimester: Secondary | ICD-10-CM

## 2020-10-31 DIAGNOSIS — Z3A35 35 weeks gestation of pregnancy: Secondary | ICD-10-CM

## 2020-10-31 DIAGNOSIS — O365930 Maternal care for other known or suspected poor fetal growth, third trimester, not applicable or unspecified: Secondary | ICD-10-CM

## 2020-10-31 LAB — RPR: RPR Ser Ql: NONREACTIVE

## 2020-10-31 MED ORDER — COCONUT OIL OIL
1.0000 "application " | TOPICAL_OIL | Status: DC | PRN
  Administered 2020-10-31: 1 via TOPICAL

## 2020-10-31 MED ORDER — ONDANSETRON HCL 4 MG/2ML IJ SOLN: 4.0000 mg | INTRAMUSCULAR | Status: DC | PRN

## 2020-10-31 MED ORDER — ONDANSETRON HCL 4 MG PO TABS: 4.0000 mg | ORAL_TABLET | ORAL | Status: DC | PRN

## 2020-10-31 MED ORDER — DIPHENHYDRAMINE HCL 25 MG PO CAPS: 25.0000 mg | ORAL_CAPSULE | Freq: Four times a day (QID) | ORAL | Status: DC | PRN

## 2020-10-31 MED ORDER — SENNOSIDES-DOCUSATE SODIUM 8.6-50 MG PO TABS
2.0000 | ORAL_TABLET | Freq: Every day | ORAL | Status: DC
  Administered 2020-11-01: 2 via ORAL
  Filled 2020-10-31: qty 2

## 2020-10-31 MED ORDER — SIMETHICONE 80 MG PO CHEW: 80.0000 mg | CHEWABLE_TABLET | ORAL | Status: DC | PRN

## 2020-10-31 MED ORDER — ZOLPIDEM TARTRATE 5 MG PO TABS: 5.0000 mg | ORAL_TABLET | Freq: Every evening | ORAL | Status: DC | PRN

## 2020-10-31 MED ORDER — DIBUCAINE (PERIANAL) 1 % EX OINT: 1.0000 "application " | TOPICAL_OINTMENT | CUTANEOUS | Status: DC | PRN

## 2020-10-31 MED ORDER — LISINOPRIL 5 MG PO TABS
5.0000 mg | ORAL_TABLET | Freq: Every day | ORAL | Status: DC
  Administered 2020-10-31 – 2020-11-01 (×2): 5 mg via ORAL
  Filled 2020-10-31 (×4): qty 1

## 2020-10-31 MED ORDER — WITCH HAZEL-GLYCERIN EX PADS: 1.0000 "application " | MEDICATED_PAD | CUTANEOUS | Status: DC | PRN

## 2020-10-31 MED ORDER — BENZOCAINE-MENTHOL 20-0.5 % EX AERO
1.0000 "application " | INHALATION_SPRAY | CUTANEOUS | Status: DC | PRN
  Administered 2020-10-31: 1 via TOPICAL
  Filled 2020-10-31: qty 56

## 2020-10-31 MED ORDER — ACETAMINOPHEN 325 MG PO TABS
650.0000 mg | ORAL_TABLET | ORAL | Status: DC | PRN
Start: 2020-10-31 — End: 2020-11-01

## 2020-10-31 MED ORDER — IBUPROFEN 600 MG PO TABS
600.0000 mg | ORAL_TABLET | Freq: Four times a day (QID) | ORAL | Status: DC
  Administered 2020-10-31 – 2020-11-01 (×6): 600 mg via ORAL
  Filled 2020-10-31 (×2): qty 1
  Filled 2020-10-31: qty 3
  Filled 2020-10-31 (×3): qty 1

## 2020-10-31 MED ORDER — TETANUS-DIPHTH-ACELL PERTUSSIS 5-2.5-18.5 LF-MCG/0.5 IM SUSY: 0.5000 mL | PREFILLED_SYRINGE | Freq: Once | INTRAMUSCULAR | Status: DC

## 2020-10-31 MED ORDER — PRENATAL MULTIVITAMIN CH
1.0000 | ORAL_TABLET | Freq: Every day | ORAL | Status: DC
  Administered 2020-10-31 – 2020-11-01 (×2): 1 via ORAL
  Filled 2020-10-31 (×2): qty 1

## 2020-10-31 NOTE — Discharge Summary (Addendum)
Postpartum Discharge Summary      Patient Name: Michelle Garrison DOB: January 25, 1991 MRN: [id number]  Date of admission: 10/30/2020 Delivery date:10/31/2020  Delivering provider: Fatima Blank A  Date of discharge: 11/01/2020  Admitting diagnosis: IUGR (intrauterine growth restriction) affecting care of mother [O36.5990] Intrauterine pregnancy: [redacted]w[redacted]d    Secondary diagnosis:  Active Problems:   Supervision of high risk pregnancy, antepartum   Chronic hypertension during pregnancy   History of trichomoniasis   Dysplasia of cervix, high grade CIN 2   Abnormal chromosomal and genetic finding on antenatal screening mother   [place]   Abnormal fetal echocardiogram affecting antepartum care of mother   Hemorrhoids during pregnancy in third trimester   IUGR (intrauterine growth restriction) affecting care of mother   Vaginal delivery  Additional problems: None    Discharge diagnosis:  Pregnancy with severe IUGR delivered                                               Post partum procedures: None Augmentation: AROM, Pitocin, and IP Foley Complications: None  Hospital course: Induction of Labor With Vaginal Delivery   30y.o. yo G1P0000 at 320w3das admitted to the hospital 10/30/2020 for induction of labor.  Indication for induction:  severe fetal growth restriction, EFW 1.3% .  Patient had an uncomplicated labor course as follows: Membrane Rupture Time/Date: 1:45 AM ,10/31/2020   Delivery Method:Vaginal, Spontaneous  Episiotomy: None  Lacerations:  None  Details of delivery can be found in separate delivery note.  Patient had a routine postpartum course. Labetalol discontinued. Pt states she was on BP meds prior to pregnancy but doesn't remember what medication. Lisinopril 5 mg started. BP still borderline to mildly elevated. Will increase to 10 mg and have pt F/U w/ PCP. Pt strongly desires D/C so that she can stay with baby in NICU. Is stable for D/C. [facility] 11[facility].  Newborn Data: Birth date:10/31/2020  Birth time:1:59 AM  Gender:Female  Living status:Living  Apgars:8 ,9  Weight:1640 g   Magnesium Sulfate received: No BMZ received: No Rhophylac:No MMR:No T-DaP: declined Flu: No Transfusion:No  Physical exam  Vitals:   10/31/20 1840 10/31/20 1950 11/01/20 0538 11/01/20 1126  BP: 133/84 134/73 123/78 (!) 142/99  Pulse: 86 89 78 (!) 104  Resp: _0 Temp: 98.7 F (37.1 C) 98.1 F (36.7 C) 98.1 F (36.7 C)   TempSrc: Oral Oral Oral   SpO2: 100% 100% 100%   Weight:      Height:       General: alert, cooperative, and no distress Lochia: appropriate Uterine Fundus: firm Incision: N/A DVT Evaluation: No evidence of DVT seen on physical exam. Labs: Lab Results  Component Value Date   WBC 9.0 10/30/2020   HGB 12.9 10/30/2020   HCT 38.3 10/30/2020   MCV 97.2 10/30/2020   PLT 343 10/30/2020   CMP Latest Ref Rng & Units 10/30/2020  Glucose 70 - 99 mg/dL 96  BUN 6 - 20 mg/dL 10  Creatinine 0.44 - 1.00 mg/dL 0.74  Sodium 135 - 145 mmol/L 134(L)  Potassium 3.5 - 5.1 mmol/L 4.0  Chloride 98 - 111 mmol/L 104  CO2 22 - 32 mmol/L 19(L)  Calcium 8.9 - 10.3 mg/dL 9.8  Total Protein 6.5 -  8.1 g/dL 7.6  Total Bilirubin 0.3 - 1.2 mg/dL 0.6  Alkaline Phos 38 - 126 U/L 68  AST 15 - 41 U/L 17  ALT 0 - 44 U/L 15   Edinburgh Score: Edinburgh Postnatal Depression Scale Screening Tool 10/31/2020  I have been able to laugh and see the funny side of things. 0  I have looked forward with enjoyment to things. 0  I have blamed myself unnecessarily when things went wrong. 1  I have been anxious or worried for no good reason. 0  I have felt scared or panicky for no good reason. 0  Things have been getting on top of me. 0  I have been so unhappy that I have had difficulty sleeping. 0  I have felt sad or miserable. 0  I have been so unhappy that I have been crying. 0  The thought of harming myself  has occurred to me. 0  Edinburgh Postnatal Depression Scale Total 1     After visit meds:  Allergies as of 11/01/2020   No Known Allergies      Medication List     STOP taking these medications    aspirin EC 81 MG tablet   Ensure Nutrition Shake Liqd   folic acid 1 MG tablet Commonly known as: FOLVITE   labetalol 200 MG tablet Commonly known as: NORMODYNE   Lidocaine 4 % Gel       TAKE these medications    acetaminophen 325 MG tablet Commonly known as: Tylenol Take 2 tablets (650 mg total) by mouth every 4 (four) hours as needed for mild pain or moderate pain (for pain scale < 4).   ibuprofen 600 MG tablet Commonly known as: ADVIL Take 1 tablet (600 mg total) by mouth every 6 (six) hours.   lisinopril 5 MG tablet Commonly known as: ZESTRIL Take 2 tablets (10 mg total) by mouth daily. Start taking on: November 02, 2020   Prenatal 27-1 MG Tabs Take 1 tablet by mouth daily.   psyllium 58.6 % powder Commonly known as: METAMUCIL Take 1 packet by mouth daily as needed (constipation).         Discharge home in stable condition Infant Feeding: Breast. Pumping. Baby in NICU Infant Disposition:NICU Discharge instruction: per After Visit Summary and Postpartum booklet. Activity: Advance as tolerated. Pelvic rest for 6 weeks.  Diet: routine diet Future Appointments: Future Appointments  Date Time Provider [place]  11/09/2020 10:55 AM Griffin Basil, MD [facility]   Follow up Visit:  [place] [facility] [place] [facility] Follow up.   Specialty: Obstetrics and Gynecology Why: Blood pressure check in 2-3 days and postpartum visit in 4 weeks.  Sooner if need for postpartum complications or symptoms or Pre-eclampsia. Contact information: [address] [place] [address] [phone]        [facility] Follow up.   Specialty: Obstetrics and  Gynecology Why: Sooner if need for postpartum complications or symptoms or Pre-eclampsia. Contact information: [address] [id number] [place] [phone]        Your primary care provider. Schedule an appointment as soon as possible for a visit in 1 month(s).   Why: For blood pressure management                 Please schedule this patient for a In person postpartum visit in 4 weeks with the following provider: MD. Additional  Postpartum F/U:Postpartum Depression checkup and BP check 1 week  High risk pregnancy complicated by:  CHTN, Fetal growth restriction, suspected Down Syndrome Delivery mode:  Vaginal, Spontaneous  Anticipated Birth Control:   declined   11/01/2020 Manya Silvas, CNM

## 2020-10-31 NOTE — Lactation Note (Signed)
This note was copied from a baby's chart. Lactation Consultation Note NICU book, Lactation pamphlet and LPI information sheet given.  Discussed pumping w/mom. Mom has been pumping and collected some colostrum and taken to NICU. Milk storage discussed. Extra bottles for milk to take to NICU given.  Coconut oil given and applied. Mom had red ring to areola from flange. Mom stated she was pushing in hard. Worked w/mom on pump showing her how to hold pump and flange. #24 flange appears to be appropriate at this time. Ecouraged mom to call if cont. To hurt. Encouraged not to turn pump up to high if hurting.  WIC referral signed from mom. Mom has insurance but is waiting on pump. Mom has WIC and will need [facility] loaner until receives pump from insurance.  Mom in good spirts.  Worked on hand expression. And answered questions mom had.   Patient Name: Girl Aracelli Woloszyn [id number] Date: 10/31/2020 Reason for consult: Initial assessment;NICU baby;Primapara;Late-preterm 34-36.6wks;Infant < 6lbs Age:31 hours  Maternal Data Has patient been taught Hand Expression?: Yes Does the patient have breastfeeding experience prior to this delivery?: No  Feeding    LATCH Score       Type of Nipple: Flat (semi flat)  Comfort (Breast/Nipple): Filling, red/small blisters or bruises, mild/mod discomfort (breast soft. irritation ring from pump flange)         Lactation Tools Discussed/Used Tools: Pump;Coconut oil Breast pump type: Double-Electric Breast Pump Pump Education: Setup, frequency, and cleaning Reason for Pumping: LPI/NICU Pumping frequency: Q 3h  Interventions Interventions: Coconut oil;Breast massage;Hand express;DEBP  Discharge Pump: DEBP WIC Program: Yes  Consult Status Consult Status: Follow-up Date: 11/01/20 Follow-up type: In-patient    Charyl Dancer 10/31/2020, 8:59 PM

## 2020-10-31 NOTE — Lactation Note (Signed)
This note was copied from a baby's chart. Lactation Consultation Note  Patient Name: Michelle Garrison [id number] Date: 10/31/2020 Age:31 hours  Attempted LC visit to  P1 of NICU baby. Mother is not in room at the time of visit. LC will come back to room at another time as possible.     Mikiah Demond A Higuera Ancidey 10/31/2020, 4:20 PM

## 2020-10-31 NOTE — Lactation Note (Addendum)
This note was copied from a baby's chart. Lactation Consultation Note Baby went to NICU trigeminy 21 Spoke w/mom about pumping for breast stimulation, milk storage, what to expect when pumping and rest. Will f/u mom on floor. No charge.  Patient Name: Michelle Garrison [id number] Date: 10/31/2020 Reason for consult: L&D Initial assessment;Primapara;Late-preterm 34-36.6wks;Other (Comment);NICU baby Age:31 hours  Maternal Data    Feeding    LATCH Score                    Lactation Tools Discussed/Used    Interventions    Discharge    Consult Status Consult Status: Follow-up Date: 10/31/20 Follow-up type: In-patient    Charyl Dancer 10/31/2020, 2:53 AM

## 2020-11-01 ENCOUNTER — Ambulatory Visit: Payer: Self-pay

## 2020-11-01 MED ORDER — ACETAMINOPHEN 325 MG PO TABS: 650.0000 mg | ORAL_TABLET | ORAL | 1 refills | Status: DC | PRN

## 2020-11-01 MED ORDER — IBUPROFEN 600 MG PO TABS: 600.0000 mg | ORAL_TABLET | Freq: Four times a day (QID) | ORAL | 0 refills | Status: DC

## 2020-11-01 MED ORDER — LISINOPRIL 5 MG PO TABS: 10.0000 mg | ORAL_TABLET | Freq: Every day | ORAL | 1 refills | Status: DC

## 2020-11-01 NOTE — Progress Notes (Signed)
Call placed to main pharmacy to please send Lisinopril dose

## 2020-11-01 NOTE — Lactation Note (Signed)
This note was copied from a baby's chart. Lactation Consultation Note LC to infant's room in NICU for f/u visit with mother who is pumping q3. Will plan f/u visit.   POC:  mom to continue pumping q3 and add HE to increase yield Will use coconut oil for breast soreness today prn  Patient Name: Michelle Garrison [id number] Date: 11/01/2020 Reason for consult: Follow-up assessment Age:31 hours  Maternal Data  Pumping frequency: q3 with drops of colostrum today  Feeding Mother's Current Feeding Choice: Breast Milk and Formula  Interventions Interventions: Education  Consult Status Consult Status: Follow-up Follow-up type: In-patient   Elder Negus, MA IBCLC 11/01/2020, 5:42 PM

## 2020-11-01 NOTE — Anesthesia Postprocedure Evaluation (Signed)
Anesthesia Post Note  Patient: Michelle Garrison  Procedure(s) Performed: AN AD HOC LABOR EPIDURAL     Patient location during evaluation: Mother Baby Anesthesia Type: Epidural Level of consciousness: awake and alert Pain management: pain level controlled Vital Signs Assessment: post-procedure vital signs reviewed and stable Respiratory status: spontaneous breathing, nonlabored ventilation and respiratory function stable Cardiovascular status: stable Postop Assessment: no headache, no backache, epidural receding, no apparent nausea or vomiting, patient able to bend at knees, adequate PO intake and able to ambulate Anesthetic complications: no   No notable events documented.  Last Vitals:  Vitals:   11/01/20 0538 11/01/20 1126  BP: 123/78 (!) 142/99  Pulse: 78 (!) 104  Resp:  18  Temp: 36.7 C   SpO2: 100%     Last Pain:  Vitals:   11/01/20 0730  TempSrc:   PainSc: Asleep   Pain Goal:                   Laban Emperor

## 2020-11-01 NOTE — Progress Notes (Addendum)
POSTPARTUM PROGRESS NOTE  Subjective: Michelle Garrison is a 30 y.o. G1P0000 PPD#1 s/p SVD at [redacted]w[redacted]d.  She reports she doing well. No acute events overnight. She denies any problems with ambulating, voiding or po intake. Denies nausea or vomiting. She has passed flatus. Pain is well controlled.  Lochia is minimal.  Objective: Blood pressure 123/78, pulse 78, temperature 98.1 F (36.7 C), temperature source Oral, resp. rate 20, height 5\' 6"  (1.676 m), weight 76.5 kg, last menstrual period 02/26/2020, SpO2 100 %.  Physical Exam:  General: alert, cooperative and no distress Chest: no respiratory distress Abdomen: soft, non-tender  Uterine Fundus: firm and at level of umbilicus Extremities: No calf swelling or tenderness  No edema  Recent Labs    10/30/20 1121  HGB 12.9  HCT 38.3    Assessment/Plan: Michelle Garrison is a 30 y.o. G1P0000 PPD#1 s/p SVD at [redacted]w[redacted]d.  Routine Postpartum Care: Doing well, pain well-controlled.  -- Continue routine care, lactation support  -- Contraception: Declined -- Feeding: breast  Dispo: Pt is staying another night given infant status. Will reassess tomorrow.   [redacted]w[redacted]d, MD, PGY1 Faculty Practice, [facility] 11/01/2020 7:44 AM

## 2020-11-02 ENCOUNTER — Ambulatory Visit: Payer: Self-pay

## 2020-11-02 NOTE — Lactation Note (Signed)
This note was copied from a baby's chart. 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  LC Dacia called LC because mom wanted to schedule a feeding assist and had some questions regarding pumping and hand expression.  LC assisted with hand expression, and rubbed her colostrum on the pumping ring that she had, mom voiced there has been improvement the last few days.   NICU RN took baby STS to mother's breast in cross cradle position, baby's mouth is smaller than mom's nipples/areola complex and needed the teacup hold to achieve a latch. Baby sucked on and off for 3 minutes, explained to mom that at this point is more like "practice at the breast" and encouraged her to do as much STS as she can.  Mom was discharged 2 days ago. Reviewed engorgement & sore nipples prevention/treatment.   Plan of Care  Encouraged mom to continue pumping every 3 hours, ideally 8 pumping sessions/24 hours She'll continue using coconut oil prior pumping sessions She'll add breast massage and hand expression prior pumping  No support person other than mom at the time of [facility] consultation, she's rooming in with baby in the NICU. Mom reported all questions and concerns were answered, she's aware of LC services and will call PRN.  Maternal Data    Feeding Mother's Current Feeding Choice: Breast Milk and Formula  LATCH Score Latch: Repeated attempts needed to sustain latch, nipple held in mouth throughout feeding, stimulation needed to elicit sucking reflex.  Audible Swallowing: None  Type of Nipple: Flat (semi flat)  Comfort (Breast/Nipple): Filling, red/small  blisters or bruises, mild/mod discomfort (still has pumping ring but per mom is much better now)  Hold (Positioning): Assistance needed to correctly position infant at breast and maintain latch.  LATCH Score: 4   Lactation Tools Discussed/Used Tools: Pump;Coconut oil;Flanges Flange Size: 24 Breast pump type: Double-Electric Breast Pump Pump Education: Setup, frequency, and cleaning;Milk Storage Reason for Pumping: LPI < 5 lbs in NICU Pumping frequency: q 3 hours Pumped volume: 25 mL  Interventions Interventions: Breast feeding basics reviewed;Education;DEBP;Skin to skin;Assisted with latch;Breast massage;Hand express;Breast compression;Coconut oil  Discharge Discharge Education: Engorgement and breast care Pump: DEBP  Consult Status Consult Status: Follow-up Date: 11/04/20 Follow-up type: In-patient    Prapti Grussing Venetia Constable 11/02/2020, 2:34 PM

## 2020-11-03 ENCOUNTER — Encounter: Payer: Self-pay | Admitting: *Deleted

## 2020-11-03 ENCOUNTER — Ambulatory Visit: Payer: 59

## 2020-11-03 LAB — SURGICAL PATHOLOGY

## 2020-11-04 ENCOUNTER — Encounter: Payer: Self-pay | Admitting: Physician Assistant

## 2020-11-04 ENCOUNTER — Telehealth: Payer: 59 | Admitting: Physician Assistant

## 2020-11-04 DIAGNOSIS — B373 Candidiasis of vulva and vagina: Secondary | ICD-10-CM

## 2020-11-04 DIAGNOSIS — B3731 Acute candidiasis of vulva and vagina: Secondary | ICD-10-CM

## 2020-11-04 MED ORDER — FLUCONAZOLE 150 MG PO TABS: 150.0000 mg | ORAL_TABLET | Freq: Once | ORAL | 0 refills | Status: AC

## 2020-11-04 NOTE — Patient Instructions (Signed)
  Michelle Garrison, thank you for joining Piedad Climes, PA-C for today's virtual visit.  While this provider is not your primary care provider (PCP), if your PCP is located in our provider database this encounter information will be shared with them immediately following your visit.  Consent: (Patient) Michelle Garrison provided verbal consent for this virtual visit at the beginning of the encounter.  Current Medications:  Current Outpatient Medications:    acetaminophen (TYLENOL) 325 MG tablet, Take 2 tablets (650 mg total) by mouth every 4 (four) hours as needed for mild pain or moderate pain (for pain scale < 4)., Disp: 30 tablet, Rfl: 1   ibuprofen (ADVIL) 600 MG tablet, Take 1 tablet (600 mg total) by mouth every 6 (six) hours., Disp: 30 tablet, Rfl: 0   lisinopril (ZESTRIL) 5 MG tablet, Take 2 tablets (10 mg total) by mouth daily., Disp: 30 tablet, Rfl: 1   Prenatal 27-1 MG TABS, Take 1 tablet by mouth daily., Disp: , Rfl:    psyllium (METAMUCIL) 58.6 % powder, Take 1 packet by mouth daily as needed (constipation)., Disp: , Rfl:    Medications ordered in this encounter:  No orders of the defined types were placed in this encounter.    *If you need refills on other medications prior to your next appointment, please contact your pharmacy*  Follow-Up: Call back or seek an in-person evaluation if the symptoms worsen or if the condition fails to improve as anticipated.  Other Instructions Please take the Diflucan as directed. You can use OTC cortisone cream to the external vaginal structures (labia majora) and surrounding skin. Continue postpartum care as directed by your OB. Follow-up with them if symptoms are not resolving, anything worsens or new symptoms develop.  Congratulations on the little one!   If you have been instructed to have an in-person evaluation today at a local Urgent Care facility, please use the link below. It will take you to a list of all of  our available [facility], including address, phone number and hours of operation. Please do not delay care.  [facility]  If you or a family member do not have a primary care provider, use the link below to schedule a visit and establish care. When you choose a [facility] primary care physician or advanced practice provider, you gain a long-term partner in health. Find a Primary Care Provider  Learn more about [facility] in-office and virtual care options: [facility] - Get Care Now

## 2020-11-04 NOTE — Progress Notes (Signed)
Virtual Visit Consent   Japneet Staggs, you are scheduled for a virtual visit with a  provider today.     Just as with appointments in the office, your consent must be obtained to participate.  Your consent will be active for this visit and any virtual visit you may have with one of our providers in the next 365 days.     If you have a MyChart account, a copy of this consent can be sent to you electronically.  All virtual visits are billed to your insurance company just like a traditional visit in the office.    As this is a virtual visit, video technology does not allow for your provider to perform a traditional examination.  This may limit your provider's ability to fully assess your condition.  If your provider identifies any concerns that need to be evaluated in person or the need to arrange testing (such as labs, EKG, etc.), we will make arrangements to do so.     Although advances in technology are sophisticated, we cannot ensure that it will always work on either your end or our end.  If the connection with a video visit is poor, the visit may have to be switched to a telephone visit.  With either a video or telephone visit, we are not always able to ensure that we have a secure connection.     I need to obtain your verbal consent now.   Are you willing to proceed with your visit today?    Michelle Garrison has provided verbal consent on 11/04/2020 for a virtual visit (video or telephone).   Piedad Climes, [place]   Date: 11/04/2020 7:05 PM   Virtual Visit via Video Note   I, Piedad Climes, connected with  Michelle Garrison  ([id number], 06-23-1990) on 11/04/20 at  7:00 PM EDT by a video-enabled telemedicine application and verified that I am speaking with the correct person using two identifiers.  Location: Patient: Virtual Visit Location Patient: Other: NICU at [facility] hospital -- newborn is a patient there Provider: Virtual Visit Location  Provider: Home Office   I discussed the limitations of evaluation and management by telemedicine and the availability of in person appointments. The patient expressed understanding and agreed to proceed.    History of Present Illness: Michelle Garrison is a 30 y.o. who identifies as a female who was assigned female at birth, and is being seen today for possible vaginal yeast infection post delivery of her baby on 11/01/2020. Patient notes significant vaginal pruritus starting early this morning and progressing throughout the day. Is still passing uterine products/blood so hard to evaluate current discharge per patient. Denies fever, chills, malaise. Denies urinary symptoms. Has significant history of yeast infections and this feels identical to her.   HPI: HPI  Problems:  Patient Active Problem List   Diagnosis Date Noted   Vaginal delivery 10/31/2020   IUGR (intrauterine growth restriction) affecting care of mother 10/30/2020   Abnormal fetal echocardiogram affecting antepartum care of mother 10/07/2020   Hemorrhoids during pregnancy in third trimester 10/07/2020   Intrauterine growth restriction, antepartum, third trimester, not applicable or unspecified fetus 09/24/2020   Sickle cell trait (HCC) 07/05/2020   Abnormal chromosomal and genetic finding on antenatal screening mother 07/01/2020   Constipation, chronic 06/22/2020   Anal fissure 06/22/2020   History of trichomoniasis 06/08/2020   Dysplasia of cervix, 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   Supervision of high risk pregnancy, antepartum 05/26/2020  Chronic hypertension during pregnancy 05/26/2020   Infertility, female 05/26/2020   Hemorrhoids, external, thrombosed 01/20/2013    Allergies: No Known Allergies Medications:  Current Outpatient Medications:    fluconazole (DIFLUCAN) 150 MG tablet, Take 1 tablet (150 mg total) by mouth once for 1 dose., Disp: 1 tablet, Rfl: 0   acetaminophen (TYLENOL) 325 MG tablet, Take 2 tablets (650  mg total) by mouth every 4 (four) hours as needed for mild pain or moderate pain (for pain scale < 4)., Disp: 30 tablet, Rfl: 1   ibuprofen (ADVIL) 600 MG tablet, Take 1 tablet (600 mg total) by mouth every 6 (six) hours., Disp: 30 tablet, Rfl: 0   lisinopril (ZESTRIL) 5 MG tablet, Take 2 tablets (10 mg total) by mouth daily., Disp: 30 tablet, Rfl: 1   Prenatal 27-1 MG TABS, Take 1 tablet by mouth daily., Disp: , Rfl:    psyllium (METAMUCIL) 58.6 % powder, Take 1 packet by mouth daily as needed (constipation)., Disp: , Rfl:   Observations/Objective: Patient is well-developed, well-nourished in no acute distress.  Resting comfortably at home.  Head is normocephalic, atraumatic.  No labored breathing. Speech is clear and coherent with logical content.  Patient is alert and oriented at baseline.   Assessment and Plan: 1. Vaginal yeast infection - fluconazole (DIFLUCAN) 150 MG tablet; Take 1 tablet (150 mg total) by mouth once for 1 dose.  Dispense: 1 tablet; Refill: 0 Yeast infection s/p vaginal delivery at the beginning of the week. No alarm signs/symptoms present. Supportive measures and OTC medications for itch reviewed. Rx Diflucan to take as directed. Follow-up with OBGYN for any non-resolving symptoms.  Follow Up Instructions: I discussed the assessment and treatment plan with the patient. The patient was provided an opportunity to ask questions and all were answered. The patient agreed with the plan and demonstrated an understanding of the instructions.  A copy of instructions were sent to the patient via MyChart.  The patient was advised to call back or seek an in-person evaluation if the symptoms worsen or if the condition fails to improve as anticipated.  Time:  I spent 10 minutes with the patient via telehealth technology discussing the above problems/concerns.    Piedad Climes, PA-C

## 2020-11-05 ENCOUNTER — Ambulatory Visit: Payer: Self-pay

## 2020-11-05 NOTE — Lactation Note (Signed)
This note was copied from a baby's chart. Lactation Consultation Note  Patient Name: Michelle Garrison [id number] Date: 11/05/2020 Reason for consult: Follow-up assessment;Mother's request;Other (Comment);Primapara;1st time breastfeeding;Late-preterm 34-36.6wks;Infant < 6lbs (trysomy 21) Age:31 days  Visited with mom of 48 days old LPI female, she's a P1 and requested a feeding assist. Mom wanted to try the NS because baby was having a hard time latching on, her mouth is really small.   Mom's milk volume started to increase, mom tried latching baby on first without the NS and then using a NS # 24. Baby would do a few sucks on and off without the NS but kept unlatching constantly.  Baby would suck a bit more consistently with NS # 24 though, even though she would spit it of her mouth. Only a couple of audible swallows heard during this feeding, pool of colostrum observed in NS at the end of the feeding. Baby might do better with a NS# 20 but mom needs a # 24.  Plan of Care   Encouraged mom to continue pumping every 3 hours, ideally 8 pumping sessions/24 hours She'll continue using coconut oil prior pumping sessions She'll add breast massage and hand expression prior pumping Will call LC for feeding assist when needed. Will try STS/practice at the breast if baby is cueing    No support person other than mom at the time of [facility] consultation, she's rooming in with baby in the NICU. Mom reported all questions and concerns were answered, she's aware of LC services and will call PRN.    Maternal Data    Feeding Mother's Current Feeding Choice: Breast Milk and Formula  LATCH Score Latch: Repeated attempts needed to sustain latch, nipple held in mouth throughout feeding, stimulation needed to elicit sucking reflex. (with NS # 24)  Audible Swallowing: None (only a couple heard)  Type of Nipple: Everted at rest and after stimulation (semi-flat/short shafted)  Comfort (Breast/Nipple): Soft /  non-tender  Hold (Positioning): Assistance needed to correctly position infant at breast and maintain latch.  LATCH Score: 6   Lactation Tools Discussed/Used Tools: Pump;Coconut oil;Nipple Shields Nipple shield size: 24 Flange Size: 24 Breast pump type: Double-Electric Breast Pump Pump Education: Setup, frequency, and cleaning;Milk Storage Reason for Pumping: LPI < 5 lbs Pumping frequency: q 3-4 hours Pumped volume: 70 mL (+ 60 when she misses pumping sessions)  Interventions Interventions: Breast feeding basics reviewed;Assisted with latch;Skin to skin;Breast massage;Hand express;Breast compression;Adjust position;Coconut oil;DEBP;Education  Discharge Pump: DEBP  Consult Status Consult Status: Follow-up Date: 11/11/20 Follow-up type: In-patient    Michelle Garrison 11/05/2020, 4:11 PM

## 2020-11-06 ENCOUNTER — Ambulatory Visit: Payer: 59

## 2020-11-08 ENCOUNTER — Ambulatory Visit: Payer: Self-pay

## 2020-11-08 NOTE — Lactation Note (Addendum)
This note was copied from a baby's chart. Lactation Consultation Note  Patient Name: Girl Darrielle Pflieger [id number] Date: 11/08/2020 Reason for consult: Follow-up assessment;Other (Comment);Primapara;1st time breastfeeding;Late-preterm 34-36.6wks;Infant < 6lbs;NICU baby (trysomy 21) Age:31 days  Visited with mom of 31 days old FT female, she's a P1 and now pumping consistently. She has also been taking baby to breast to do some "lick and learn" per mom baby would latch briefly and then "let go".   Mom's breasts are soft, areolar tissue is compressible, no s/s of engorgement. Reviewed supply/demand, benefits of premature milk and STS care.  Plan of Care   Encouraged mom to continue pumping every 3 hours, ideally 8 pumping sessions/24 hours She'll continue using coconut oil prior pumping sessions She'll add breast massage and hand expression prior pumping Will call LC for feeding assist when needed. Will try STS/practice at the breast if baby is showing feeding cues   No support person other than mom at the time of [facility] consultation, she's rooming in with baby in the NICU. Mom reported all questions and concerns were answered, she's aware of LC services and will call PRN.   Maternal Data    Feeding Mother's Current Feeding Choice: Breast Milk  Lactation Tools Discussed/Used Tools: Pump;Flanges;Coconut oil Nipple shield size: 24 Flange Size: 24 Breast pump type: Double-Electric Breast Pump Pump Education: Setup, frequency, and cleaning;Milk Storage Reason for Pumping: LPI < 5 lbs in NICU Pumping frequency: q 3 hours Pumped volume: 65 mL (65-70)  Interventions Interventions: Breast feeding basics reviewed;Education;DEBP;Hand express;Breast massage  Discharge Pump: DEBP  Consult Status Consult Status: Follow-up Follow-up type: In-patient    Aerith Canal Venetia Constable 11/08/2020, 6:56 PM

## 2020-11-09 ENCOUNTER — Other Ambulatory Visit: Payer: Self-pay

## 2020-11-09 ENCOUNTER — Ambulatory Visit (INDEPENDENT_AMBULATORY_CARE_PROVIDER_SITE_OTHER): Payer: 59

## 2020-11-09 VITALS — BP 131/87 | HR 74 | Wt 161.1 lb

## 2020-11-09 DIAGNOSIS — O10919 Unspecified pre-existing hypertension complicating pregnancy, unspecified trimester: Secondary | ICD-10-CM

## 2020-11-09 NOTE — Progress Notes (Signed)
Pt here today for BP check. Pt delivered on 10/31/20 vaginally. Pt has CHTN. Pt is taking 10mg  of lisinopril daily.  BP 131/87  74  Pt states only having mild headaches that is not resolved with Ibuprofen, but lays down and is resolved with rest. Denies any visual changes or edema.   Reviewed with Dr . Pt advised to continue to take meds until PP visit. Pt has PP visit on 12/01/20. Pt aware and agreeable to date and time of appt. Pt verbalized understanding to plan of care.   01/31/21, RN

## 2020-11-09 NOTE — Progress Notes (Signed)
Patient was assessed and managed by nursing staff during this encounter. I have reviewed the chart and agree with the documentation and plan. I have also made any necessary editorial changes.  Warden Fillers, MD 11/09/2020 1:23 PM

## 2020-11-10 ENCOUNTER — Telehealth (HOSPITAL_COMMUNITY): Payer: Self-pay | Admitting: *Deleted

## 2020-11-10 ENCOUNTER — Ambulatory Visit: Payer: 59

## 2020-11-10 NOTE — Telephone Encounter (Signed)
Patient voiced no questions or concerns regarding her health. Stated, "I saw my doctor yesterday, and they said my blood pressure is good." EPDS = 1. Baby remains in NICU. Patient requested RN email information regarding Baby and Me and the virtual breastfeeding and pumping support groups. Email sent. Deforest Hoyles, RN, 11/10/20, [phone]

## 2020-11-16 ENCOUNTER — Ambulatory Visit: Payer: Self-pay

## 2020-11-16 NOTE — Lactation Note (Signed)
This note was copied from a baby's chart. Lactation Consultation Note  Patient Name: Michelle Garrison [id number] Date: 11/16/2020 Reason for consult: Follow-up assessment;Other (Comment);Primapara;1st time breastfeeding;Early term 37-38.6wks;Infant < 6lbs (trysomy 21, SLP request) Age:31 wk.o.  Visited with mom of 11 49/12 weeks old LPI NICU female, SLP requested a joined consult with LC. Mom has been resized to a NS #20 but it still seems to be too big for baby's mouth. Tried a NS # 16 but it's too small for mom's nipple and baby still wouldn't latch.  Lots of tongue thrusting and irregular sucking pattern (just a few sucks) when baby was using either nipple shield. Mom took off NS and offered the bare breast and baby would only do a few sucks on the tip, and then stops. SLP Anise Salvo proceeded to assess bottle feeding.   Plan of Care   Encouraged mom to continue pumping every 3 hours, at least 8 pumping sessions/24 hours She'll continue putting baby to breast strictly on feeding cues and will use NS PRN   No further questions/concerns, mom will call back again for another feeding assist.  Maternal Data   Mom's volume is WNL  Feeding Mother's Current Feeding Choice: Breast Milk and Formula Nipple Type: Nfant Extra Slow Flow (gold)  LATCH Score Latch: Repeated attempts needed to sustain latch, nipple held in mouth throughout feeding, stimulation needed to elicit sucking reflex. (with NS # 20)  Audible Swallowing: None  Type of Nipple: Everted at rest and after stimulation  Comfort (Breast/Nipple): Soft / non-tender  Hold (Positioning): Assistance needed to correctly position infant at breast and maintain latch.  LATCH Score: 6   Lactation Tools Discussed/Used Tools: Pump;Nipple Dorris Carnes;Flanges Nipple shield size: 20 Flange Size: 24 Breast pump type: Double-Electric Breast Pump Pump Education: Setup, frequency, and cleaning Reason for Pumping: ETI < 5 lbs in NICU Pumping  frequency: 8 times/24 hours Pumped volume: 130 mL  Interventions Interventions: Breast feeding basics reviewed;Skin to skin;DEBP;Education  Discharge Pump: DEBP  Consult Status Consult Status: Follow-up Follow-up type: In-patient    Rowena Moilanen Venetia Constable 11/16/2020, 2:49 PM

## 2020-11-23 ENCOUNTER — Ambulatory Visit: Payer: Self-pay

## 2020-11-23 NOTE — Lactation Note (Signed)
This note was copied from a baby's chart. 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  Visited with mom of 38 5/7 weeks (adjusted) NICU female, she requested to see lactation because she was having some pain and tenderness on her right breast, lower right quadrant. Breast felt soft upon examination but mom reported breast was tender to touch.  Assisted mom with pumping, she says she's always able to get more out of her right breast than the left one. Discussed tips on how to tell breast has been fully (or nearly) empty, LC got her ice packs to treat pain/inflammation, mom has also been taking ibuprofen.  LC provided mom with a pumping band, she'll also switch to a comfortable nursing bra instead of wearing a sport one.  Plan of care:  Encouraged mom to continue pumping at least 8 times/24 hours She'll apply ice packs to affected area PRN She's no longer putting baby to breast but voiced she's still doing STS, encouraged to keep doing so as much as possible  All questions and concerns answered, mom to call lactation PRN.  Maternal Data   Mom's milk supply is WNL  Feeding Mother's Current Feeding Choice: Breast Milk and Formula  Lactation Tools Discussed/Used Tools: Flanges;Pump;Other (comment) (pumping band) Flange Size: 24 Breast pump type: Double-Electric Breast Pump Pump Education: Setup, frequency, and cleaning;Milk Storage Reason for Pumping: ETI < 5 lbs Pumping frequency: 8 times/24 hours Pumped volume: 140 mL (140-150)  Interventions Interventions: Breast feeding basics reviewed;DEBP;Education;Ice;Breast massage;Hand express  Discharge Pump: DEBP  Consult Status Consult Status: Follow-up Follow-up type: In-patient    Debbra Digiulio Venetia Constable 11/23/2020, 3:34 PM

## 2020-11-23 NOTE — Lactation Note (Signed)
This note was copied from a baby's chart. Lactation Consultation Note  Patient Name: Girl Caleyah Jr BZMCE'Y Date: 11/23/2020   Age:31 wk.o.  Mom called out again because she needed another pumping band. When [facility] asked how her right breast was feeling, she said the ice pack already melted and she threw it away.  Explain to mom that these ice packs are reusable and that she can use them (along with hot/cold therapy) whenever her breast feels painful. Lecithin supplement was also discussed. No further questions or concerns; continue current plan of care.  Maternal Data    Feeding Nipple Type: Dr. Cline Crock   Lactation Tools Discussed/Used    Interventions    Discharge    Consult Status      Jovon Winterhalter Venetia Constable 11/23/2020, 8:38 PM

## 2020-11-24 ENCOUNTER — Ambulatory Visit: Payer: Self-pay

## 2020-11-24 NOTE — Lactation Note (Signed)
This note was copied from a baby's chart. Lactation Consultation Note  Patient Name: Michelle Garrison PVXYI'A Date: 11/24/2020 Reason for consult: Follow-up assessment Age:31 wk.o.  I followed up with Ms. Cheek and her 18 week old daughter, Michelle Garrison. [name] was showing readiness cues to feed upon entry. Ms. Sweigert indicated that the concern with her right breast pain has resolved after treating with ice and pumping yesterday.  Ms. Crossland continues to pump strong volumes. We discussed milk storage guidelines for at home storage for milk produced in excess of the milk lab's needs.  Ms. Lepage states that she eventually would like to breast feeding [name], but at this time, she is focused on making progress with bottle feeding. I invited her to call lactation when she is ready to practice breast feeding. She verbalized understanding.   Feeding Nipple Type: Dr. Levert Feinstein Preemie  Lactation Tools Discussed/Used Breast pump type: Double-Electric Breast Pump Pump Education: Setup, frequency, and cleaning;Milk Storage Reason for Pumping: NICU; supplementation Pumping frequency: q3 hours Pumped volume: 240 mL (fills two four oz bottles each session)  Interventions Interventions: Education  Discharge Pump: DEBP  Consult Status Consult Status: Follow-up Follow-up type: In-patient    Walker Shadow 11/24/2020, 2:46 PM

## 2020-11-26 ENCOUNTER — Ambulatory Visit: Payer: Self-pay

## 2020-11-26 NOTE — Lactation Note (Signed)
This note was copied from a baby's chart. Lactation Consultation Note  Patient Name: Michelle Garrison VHQIO'N Date: 11/26/2020 Reason for consult: Follow-up assessment;NICU baby;Other (Comment);Primapara;1st time breastfeeding;Term (Trysomy 21) Age:30 years old.  Visited with mom of 80 1/7 old NICU female, she reports that the pain on her right breast is now gone since she tried the ice packs and d/c wearing the sport bras. No pain or discomfort today.  Mom and baby doing STS when entered the room, reminded mom that STS care will help keeping her great supply and that she can keep practicing at the breast whenever baby "Michelle Garrison" is showing readiness.   Plan of care:   Encouraged mom to continue pumping at least 8 times/24 hours She'll continue putting baby on feeding cues for "practice" and doing STS as much as she can She'll call NICU LC for feeding assist once baby Michelle Garrison is ready, she's focusing on bottle feeding for now; baby is making progress   All questions and concerns answered, mom to call lactation PRN.  Maternal Data   Mom's supply is ANL  Feeding Mother's Current Feeding Choice: Breast Milk Nipple Type: Dr. Levert Feinstein Preemie  Lactation Tools Discussed/Used Tools: Pump Breast pump type: Double-Electric Breast Pump Pump Education: Setup, frequency, and cleaning;Milk Storage Reason for Pumping: NICU infant Pumping frequency: q 3 hours Pumped volume: 240 mL (fills two 4 oz. bottles each session)  Interventions Interventions: Breast feeding basics reviewed;DEBP;Education  Discharge Pump: DEBP  Consult Status Consult Status: Follow-up Follow-up type: In-patient    Eilam Shrewsbury Venetia Constable 11/26/2020, 1:45 PM

## 2020-12-01 ENCOUNTER — Ambulatory Visit (INDEPENDENT_AMBULATORY_CARE_PROVIDER_SITE_OTHER): Payer: 59 | Admitting: Family Medicine

## 2020-12-01 ENCOUNTER — Other Ambulatory Visit: Payer: Self-pay

## 2020-12-01 ENCOUNTER — Other Ambulatory Visit (HOSPITAL_COMMUNITY)
Admission: RE | Admit: 2020-12-01 | Discharge: 2020-12-01 | Disposition: A | Discharge status: Home / Self Care | Payer: 59 | Source: Ambulatory Visit | Attending: Nurse Practitioner | Admitting: Nurse Practitioner

## 2020-12-01 ENCOUNTER — Encounter: Payer: Self-pay | Admitting: Family Medicine

## 2020-12-01 DIAGNOSIS — Z124 Encounter for screening for malignant neoplasm of cervix: Secondary | ICD-10-CM | POA: Diagnosis present

## 2020-12-01 NOTE — Progress Notes (Signed)
Post Partum Visit Note  Michelle Garrison is a 30 y.o. G7P0101 female who presents for a postpartum visit. I have fully reviewed the prenatal and intrapartum course. She is 5 weeks postpartum following an NSVD in setting of an induction for persistent FGR and fetal anomalies. The delivery was at [redacted]w[redacted]d gestational weeks.    Anesthesia: epidural. Postpartum course has been uncomplicated for patient. However her baby is in the NICU and being evaluated for a feeding tube and patient has been staying in NICU. Baby is feeding by both breast and bottle. Bleeding thin lochia. Bowel function is normal. Bladder function is normal. Patient is not sexually active. Postpartum depression screening: negative.   Patient defers birth control. Risks of short interval pregnancies were discussed. Potential methods of contraception were discussed.   Edinburgh Postnatal Depression Scale - 12/01/20 1435       Edinburgh Postnatal Depression Scale:  In the Past 7 Days   I have been able to laugh and see the funny side of things. 0    I have looked forward with enjoyment to things. 0    I have blamed myself unnecessarily when things went wrong. 0    I have been anxious or worried for no good reason. 0    I have felt scared or panicky for no good reason. 0    Things have been getting on top of me. 0    I have been so unhappy that I have had difficulty sleeping. 0    I have felt sad or miserable. 0    I have been so unhappy that I have been crying. 0    The thought of harming myself has occurred to me. 0    Edinburgh Postnatal Depression Scale Total 0             Health Maintenance Due  Topic Date Due   COVID-19 Vaccine (1) Never done   Pneumococcal Vaccine 2-26 Years old (1 - PCV) Never done   PAP SMEAR-Modifier  Never done   INFLUENZA VACCINE  11/30/2020   Review of Systems Pertinent items are noted in HPI.  Objective:  BP 133/87   Pulse 88   Wt 157 lb 8 oz (71.4 kg)   BMI 25.42 kg/m     General:  alert, cooperative, and appears stated age   Breasts:  not indicated  Lungs: clear to auscultation bilaterally  Heart:  regular rate and rhythm, S1, S2 normal, no murmur, click, rub or gallop  Abdomen: soft, non-tender; bowel sounds normal; no masses,  no organomegaly   GU exam:   Cervix visualized on speculum exam, PAP collected       Assessment:  Post partum visit Presents for post partum visit, healing as expected, no bleeding concerns at this time.   2. Due for cervical cancer screening - PAP done today  Plan:   Essential components of care per ACOG recommendations:  1.  Mood and well being: Patient with negative depression screening today. Reviewed local resources for support.  - Patient tobacco use? No.   - hx of drug use? No.    2. Infant care and feeding:  -Patient currently breastmilk feeding? Pumping. Discussed signs of mastitis and other breast concerns -Social determinants of health (SDOH) reviewed in EPIC. No concerns.  3. Sexuality, contraception and birth spacing - Patient does want a pregnancy in the next year.  - Discussed birth spacing of 18 months in detail and risks of quick interval pregnancy -  Reviewed forms of contraception in tiered fashion. Patient desired no method today.    4. Sleep and fatigue -Encouraged family/partner/community support of 4 hrs of uninterrupted sleep to help with mood and fatigue  5. Physical Recovery  - Discussed patients delivery and complications. She describes her labor as mixed. - Patient had a Vaginal, no problems at delivery. Patient had no lacerations. Perineal healing reviewed. Patient expressed understanding - Patient has urinary incontinence? No. - Patient is not safe to resume physical and sexual activity for 2 more weeks  6.  Health Maintenance - HM due items addressed Yes - Last pap smear-Pap smear done at today's visit.  -Breast Cancer screening indicated? No.   7. Chronic Disease/Pregnancy  Condition follow up: Hypertension - PCP follow up  Warner Mccreedy, MD, MPH OB Fellow, Faculty Practice Center for [facility], [facility]

## 2020-12-03 ENCOUNTER — Ambulatory Visit: Payer: Self-pay

## 2020-12-03 NOTE — Lactation Note (Signed)
This note was copied from a baby's chart. Lactation Consultation Note  Patient Name: Michelle Garrison [id number]'M Date: 12/03/2020 Reason for consult: Follow-up assessment;NICU baby;Primapara;1st time breastfeeding;Other (Comment);Term (trysomy 21) Age:31 wk.o.  Visited with mom of 40 1/7 (adjusted) NICU female, she voiced that she's been experiencing some pain/discomfort on the right nipple where her nipple ring used to be (was removed 5 years ago, before she was even pregnant). Mom had bilateral nipple rings, but only the right side is showing some swelling and she voiced discomfort.  LC provided mom with more ice packs, breast feel soft upon examination, no s/s of engorgement of plugged ducts. Mom was a bit discouraged with baby "Michelle Garrison"s feeding because she has stopped taking her bottles all together, she said they might put her on a different feeding tube (gastrostomy) at [facility].  Plan of care:   Encouraged mom to continue pumping at least 8 times/24 hours She'll do ice therapy for her breast as needed She'll continue putting baby to breast to do STS care   All questions and concerns answered, mom to call lactation PRN.   Maternal Data   Mom's supply is WNL  Feeding Mother's Current Feeding Choice: Breast Milk  Lactation Tools Discussed/Used Tools: Pump Breast pump type: Double-Electric Breast Pump Pump Education: Setup, frequency, and cleaning;Milk Storage Reason for Pumping: NICU infant Pumping frequency: 6-7 times/24 hours Pumped volume: 180 mL (180-240 ml)  Interventions Interventions: Breast feeding basics reviewed;DEBP;Education;Ice  Discharge Pump: DEBP  Consult Status Consult Status: Follow-up Follow-up type: In-patient    Selia Wareing Venetia Constable 12/03/2020, 8:14 PM

## 2020-12-06 LAB — CYTOLOGY - PAP
Comment: NEGATIVE
Comment: NEGATIVE
Diagnosis: HIGH — AB
HPV 16: NEGATIVE
HPV 18 / 45: NEGATIVE
High risk HPV: POSITIVE — AB

## 2020-12-07 ENCOUNTER — Ambulatory Visit: Payer: Self-pay

## 2020-12-07 ENCOUNTER — Telehealth: Payer: Self-pay

## 2020-12-07 NOTE — Lactation Note (Signed)
This note was copied from a baby's chart. Lactation Consultation Note  Patient Name: Michelle Garrison SWFUX'N Date: 12/07/2020 Reason for consult: Follow-up assessment;Other (Comment);Primapara;1st time breastfeeding;NICU baby;Term (trysomy 21) Age:31 wk.o.  Visited with mom of 24 43/33 weeks old (adjusted) NICU female, baby is getting discharged to [facility] to have cardiac surgery. Reviewed discharge education with mom and made her aware of NICU LC line.   Per mom pumping is going well and she hasn't had any more breast pain. Baby will most likely complete her care at [facility] system, all questions and concerns answered, mom to call NICU LC line PRN.  Maternal Data   Mom's supply is WNL-ANL  Feeding Mother's Current Feeding Choice: Breast Milk  Lactation Tools Discussed/Used Tools: Pump;Flanges Flange Size: 24 Breast pump type: Double-Electric Breast Pump Pump Education: Setup, frequency, and cleaning;Milk Storage Reason for Pumping: NICU infant Pumping frequency: 7-8 times/24 hours Pumped volume: 210 mL (210-240 ml)  Interventions Interventions: Breast feeding basics reviewed;DEBP;Education  Discharge Pump: DEBP  Consult Status Consult Status: Complete Date: 12/07/20 Follow-up type: Call as needed    Cuca Benassi Venetia Constable 12/07/2020, 7:31 PM

## 2020-12-07 NOTE — Telephone Encounter (Signed)
Patient sent MyChart message asking for phone call regarding PAP result. Per chart review, result has not been reviewed provider. Called pt. Pt states she has seen her results and does not understand them. 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 Explained we only test for this after 31 years of age or with abnormal PAP smear. So this may have been present prior. Resources provided via MyChart message for more information about HPV. Reviewed normal cervical cancer screening in women. Explained I cannot give recommendation for follow up but I will contact provider. Message sent to Ephriam Jenkins, MD to follow up with patient.

## 2020-12-15 ENCOUNTER — Telehealth: Payer: Self-pay | Admitting: Family Medicine

## 2020-12-15 NOTE — Telephone Encounter (Signed)
Called patient to further discuss PAP smear result. Patient received call from RN last week and discussed results after she had sent a MyChart message asking about the lab results.  Today patient reports she has no further questions. We discussed that her PAP smear showed abnormal cells at an advanced stage and we recommend she gets treatment with a LEEP. We discussed about the LEEP procedure and I let her know the schedulers will call her to schedule an appointment for the procedure.   We discussed due to the high risk status that she should schedule the appointment at her soonest availability. Patient expressed understanding  Warner Mccreedy, MD, MPH OB Fellow, Faculty Practice

## 2021-01-18 ENCOUNTER — Other Ambulatory Visit (HOSPITAL_COMMUNITY)
Admission: RE | Admit: 2021-01-18 | Discharge: 2021-01-18 | Disposition: A | Discharge status: Home / Self Care | Payer: 59 | Source: Ambulatory Visit | Attending: Family Medicine | Admitting: Family Medicine

## 2021-01-18 ENCOUNTER — Ambulatory Visit (INDEPENDENT_AMBULATORY_CARE_PROVIDER_SITE_OTHER): Payer: 59 | Admitting: Family Medicine

## 2021-01-18 ENCOUNTER — Other Ambulatory Visit: Payer: Self-pay

## 2021-01-18 VITALS — BP 121/92 | HR 69 | Wt 155.2 lb

## 2021-01-18 DIAGNOSIS — N871 Moderate cervical dysplasia: Secondary | ICD-10-CM

## 2021-01-18 DIAGNOSIS — Z3202 Encounter for pregnancy test, result negative: Secondary | ICD-10-CM

## 2021-01-18 LAB — POCT PREGNANCY, URINE: Preg Test, Ur: NEGATIVE

## 2021-01-18 NOTE — Addendum Note (Signed)
Addended byVidal Schwalbe on: 01/18/2021 05:18 PM   Modules accepted: Orders

## 2021-01-18 NOTE — Progress Notes (Signed)
    GYNECOLOGY OFFICE COLPOSCOPY PROCEDURE NOTE  30 y.o. G1P0101 here for colposcopy for high-grade squamous intraepithelial neoplasia  (HGSIL-encompassing moderate and severe dysplasia) pap smear on 12/01/2020. Discussed role for HPV in cervical dysplasia, need for surveillance.  Patient gave informed written consent, time out was performed.  Placed in lithotomy position. Cervix viewed with speculum and colposcope after application of acetic acid.   Colposcopy adequate? Yes  acetowhite lesion(s) noted at posterior SCJ entirely Biopsies obtained at 12, 2, 4, 6 o'clock.  ECC specimen obtained. All specimens were labeled and sent to pathology.  Patient was given post procedure instructions.  Will follow up pathology and manage accordingly; patient will be contacted with results and recommendations.  Routine preventative health maintenance measures emphasized.    Reva Bores 01/18/2021 5:10 PM

## 2021-01-20 LAB — SURGICAL PATHOLOGY

## 2021-01-30 ENCOUNTER — Encounter: Payer: Self-pay | Admitting: Radiology

## 2021-02-03 ENCOUNTER — Encounter: Payer: Self-pay | Admitting: Family Medicine

## 2021-02-03 ENCOUNTER — Telehealth (INDEPENDENT_AMBULATORY_CARE_PROVIDER_SITE_OTHER): Payer: 59 | Admitting: Family Medicine

## 2021-02-03 DIAGNOSIS — N871 Moderate cervical dysplasia: Secondary | ICD-10-CM | POA: Diagnosis not present

## 2021-02-05 ENCOUNTER — Encounter: Payer: Self-pay | Admitting: Family Medicine

## 2021-02-05 NOTE — Assessment & Plan Note (Signed)
CIN2--discussed treatment options. Will move to Cryo

## 2021-02-05 NOTE — Progress Notes (Signed)
    GYNECOLOGY VIRTUAL VISIT ENCOUNTER NOTE  Provider location: Center for [facility]   Patient location: Home  I connected with Michelle Garrison on 02/03/2021 at  2:55 PM EDT by MyChart Video Encounter and verified that I am speaking with the correct person using two identifiers.   I discussed the limitations, risks, security and privacy concerns of performing an evaluation and management service virtually and the availability of in person appointments. I also discussed with the patient that there may be a patient responsible charge related to this service. The patient expressed understanding and agreed to proceed.   History:  Michelle Garrison is a 30 y.o. G8P0101 female being evaluated today for discussion of results. She had a pap smear showing HGSIL. Colposcopy reveals CIN2 with negative ECC. Desires more children. She denies any abnormal vaginal discharge, bleeding, pelvic pain or other concerns.       Past Medical History:  Diagnosis Date   Anal fissure    History of chlamydia    in high school   History of gonorrhea    in high school   Hypertension    Infertility, female    Rectal prolapse    Past Surgical History:  Procedure Laterality Date   MINOR HEMORRHOIDECTOMY  01/12/2013   I&D recurrent thrombosed hemorrhoid   The following portions of the patient's history were reviewed and updated as appropriate: allergies, current medications, past family history, past medical history, past social history, past surgical history and problem list.   Health Maintenance:  Pap results discussed above  Review of Systems:  Pertinent items noted in HPI and remainder of comprehensive ROS otherwise negative.  Physical Exam:   General:  Alert, oriented and cooperative. Patient appears to be in no acute distress.  Mental Status: Normal mood and affect. Normal behavior. Normal judgment and thought content.   Respiratory: Normal respiratory  effort, no problems with respiration noted  Rest of physical exam deferred due to type of encounter  Labs and Imaging No results found for this or any previous visit (from the past 336 hour(s)). No results found.     Assessment and Plan:        Problem List Items Addressed This Visit       Unprioritized   Dysplasia of cervix, high grade CIN 2    CIN2--discussed treatment options. Will move to Cryo       I discussed the assessment and treatment plan with the patient. The patient was provided an opportunity to ask questions and all were answered. The patient agreed with the plan and demonstrated an understanding of the instructions.   The patient was advised to call back or seek an in-person evaluation/go to the ED if the symptoms worsen or if the condition fails to improve as anticipated.  I provided 9 minutes of face-to-face time during this encounter.  Return in about 4 weeks (around 03/03/2021) for cryo with me.  Reva Bores, MD Center for [employer], [facility]

## 2021-03-04 ENCOUNTER — Ambulatory Visit (INDEPENDENT_AMBULATORY_CARE_PROVIDER_SITE_OTHER): Payer: 59 | Admitting: Family Medicine

## 2021-03-04 ENCOUNTER — Other Ambulatory Visit: Payer: Self-pay

## 2021-03-04 VITALS — BP 135/98 | HR 77 | Ht 66.0 in | Wt 153.2 lb

## 2021-03-04 DIAGNOSIS — Z3202 Encounter for pregnancy test, result negative: Secondary | ICD-10-CM

## 2021-03-04 DIAGNOSIS — N871 Moderate cervical dysplasia: Secondary | ICD-10-CM

## 2021-03-04 LAB — POCT PREGNANCY, URINE: Preg Test, Ur: NEGATIVE

## 2021-03-04 NOTE — Progress Notes (Signed)
    GYNECOLOGY CLINIC PROCEDURE NOTE  Cryotherapy details  Indication: CIN II pathology after colposcopy on 01/18/2021 after high-grade squamous intraepithelial neoplasia  (HGSIL-encompassing moderate and severe dysplasia) pap on 12/01/2020.  The indications for cryotherapy were reviewed with the patient in detail. She was counseled about that efficacy of this procedure, and possible need for excisional procedure in the future if her cervical dysplasia persists.  The risks of the procedure where explained in detail and patient was told to expect a copious amount of discharge in the next few weeks. All her questions were answered, and written informed consent was obtained.  The patient was placed in the dorsal lithotomy position and a vaginal speculum was placed. Her cervix was visualized and patient was noted to have had normal size transformation zone. The appropriate cryotherapy probe was picked and affixed to cryotherapy apparatus. Then nitrogen gas was then activated, the probe was coated with lubricating jelly and applied to the transformation zone of the cervix. This was kept in place for 3 minutes. The cryotherapy was then stopped and all instruments were removed from the patient's pelvis; a thawing period of 3 minutes was observed.  A second cycle of cryotherapy was then administered to the cervix for 3 minutes.  The patient tolerated the procedure well without any complications. Routine post procedure instructions were given to the patient.  Will repeat pap smear in 6 months and manage accordingly.  Reva Bores, MD 03/04/2021 3:05 PM

## 2021-08-16 IMAGING — US US MFM UA CORD DOPPLER
1 series · 12 of 28 positions shown · non-contrast
Comparison: none

[Series 1: us mfm ua cord doppler · 51 acquisitions, 12 frames shown]
[im 2/51]
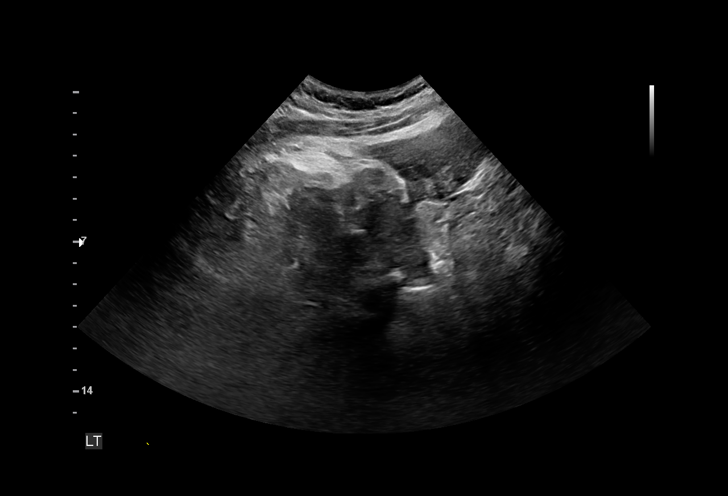
[im 6/51]
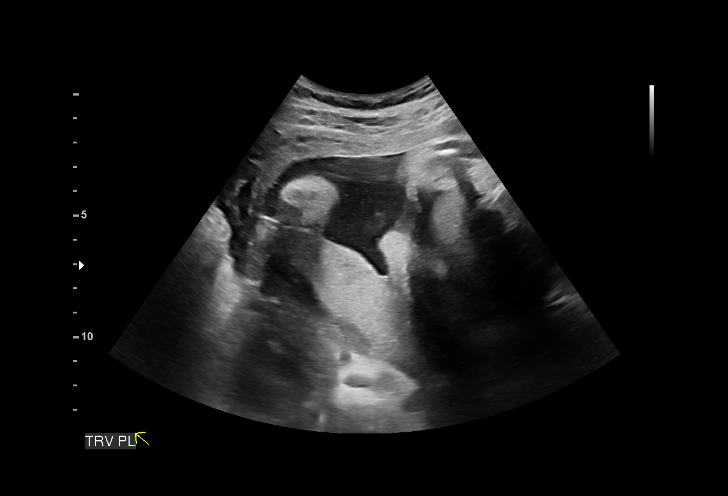
[im 10/51]
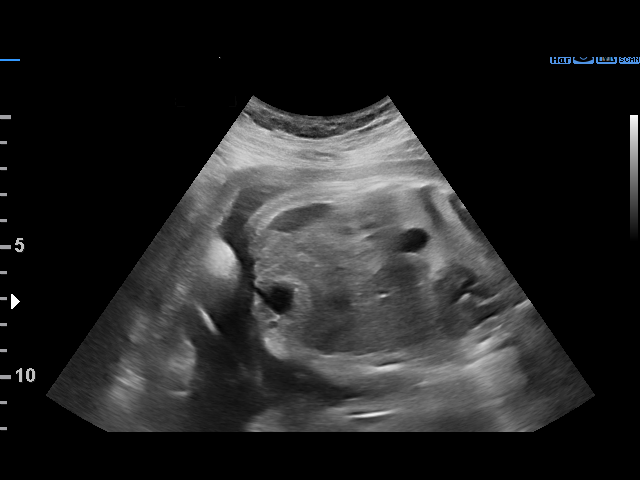
[im 15/51]
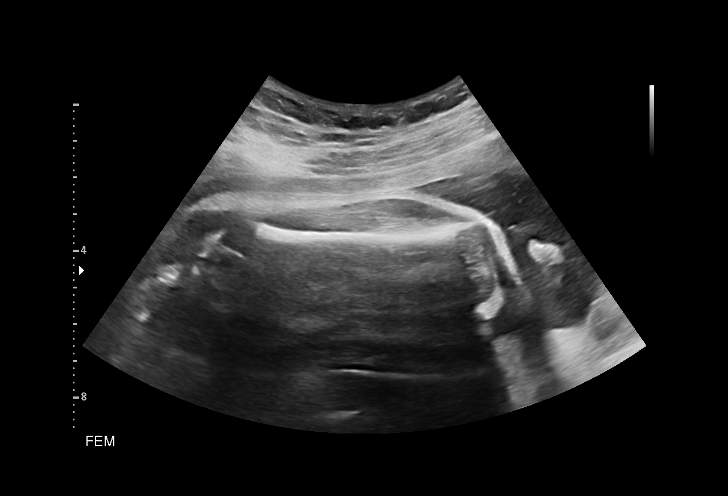
[im 19/51]
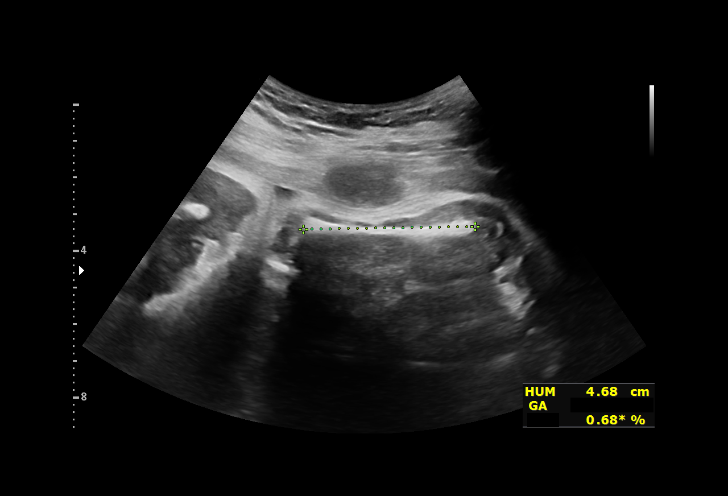
[im 23/51]
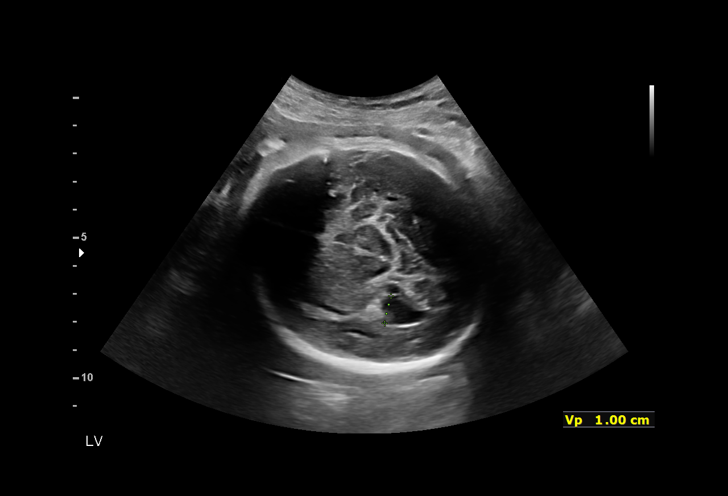
[im 28/51]
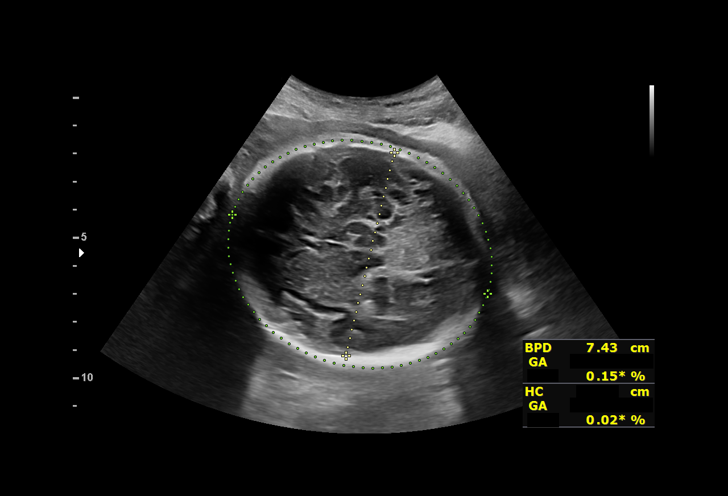
[im 32/51]
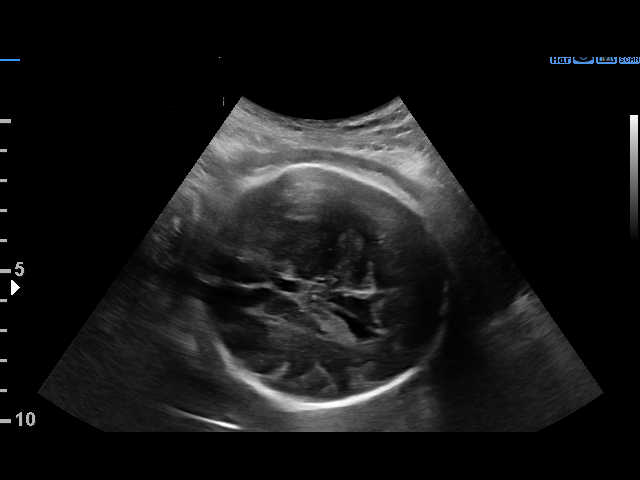
[im 36/51]
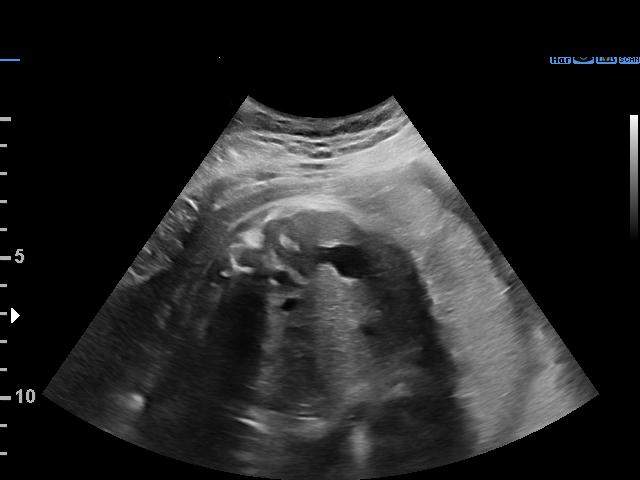
[im 41/51]
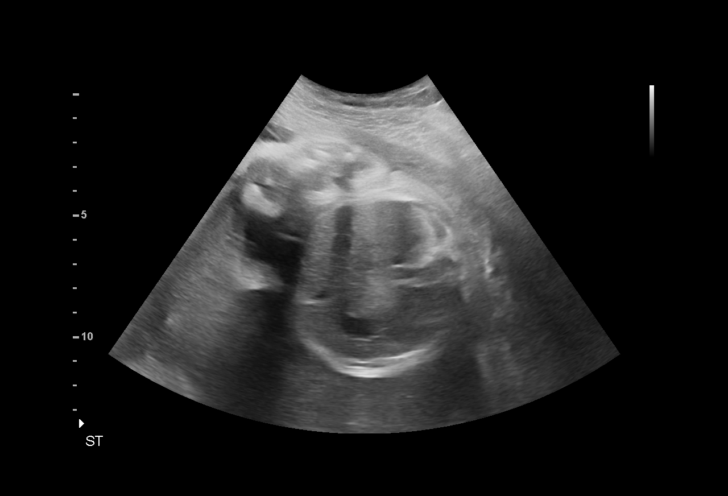
[im 45/51]
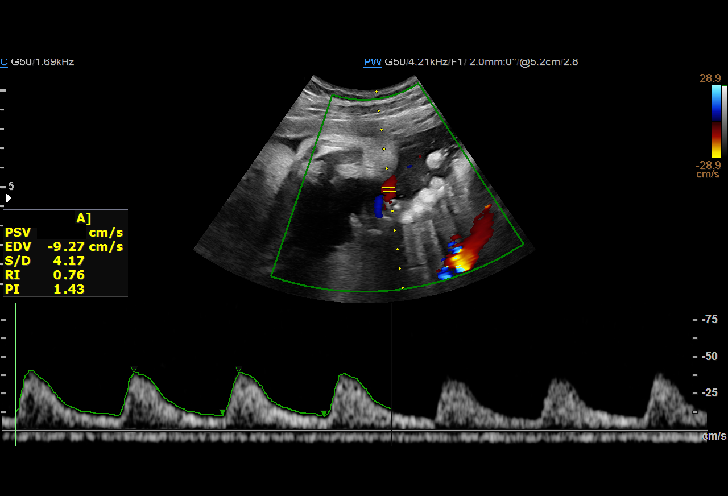
[im 49/51]
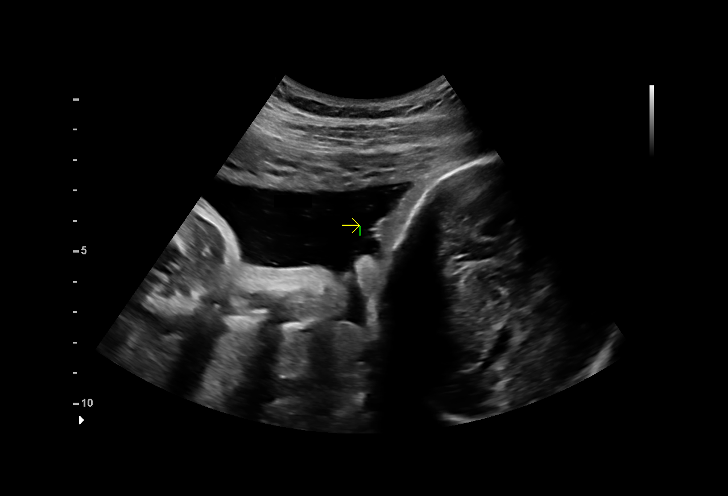

[12 of 28 positions shown; findings below may reference images not displayed]

W/NONSTRESS

Indications

 Maternal care for known or suspected poor
 fetal growth, third trimester, not applicable or
 unspecified IUGR
 33 weeks gestation of pregnancy
 Fetal Trisomy 21 affecting care of mother,
 antepartum (per NIPS)
 Fetal cardiac anomaly affecting pregnancy,
 antepartum (known AV canal)
 Hypertension - Chronic/Pre-
 existing(Labetalol)
 History of sickle cell trait
 Encounter for other antenatal screening
 follow-up
Fetal Evaluation

 Num Of Fetuses:         1
 Preg. Location:         Intrauterine
 Fetal Heart Rate(bpm):  136
 Cardiac Activity:       Observed
 Presentation:           Cephalic
 Placenta:               Posterior
 P. Cord Insertion:      Previously Visualized
 Amniotic Fluid
 AFI FV:      Within normal limits

 AFI Sum(cm)     %Tile       Largest Pocket(cm)
 10.55           22

 RUQ(cm)       RLQ(cm)       LUQ(cm)        LLQ(cm)

Biophysical Evaluation

 Amniotic F.V:   Within normal limits       F. Tone:        Observed
 F. Movement:    Observed                   N.S.T:          Nonreactive
 F. Breathing:   Not Observed               Score:          [DATE]
Biometry

 BPD:      75.2  mm     G. Age:  30w 1d        < 1  %    CI:        75.37   %    70 - 86
                                                         FL/HC:      20.5   %    19.9 -
 HC:      274.7  mm     G. Age:  30w 0d        < 1  %    HC/AC:      1.07        0.96 -
 AC:      257.2  mm     G. Age:  29w 6d        < 1  %    FL/BPD:     74.9   %    71 - 87
 FL:       56.3  mm     G. Age:  29w 4d        < 1  %    FL/AC:      21.9   %    20 - 24
 HUM:      46.8  mm     G. Age:  27w 4d        < 5  %
 LV:       10.3  mm

 Est. FW:    8638  gm      3 lb 4 oz    < 1  %
OB History

 Gravidity:    1         Term:   0        Prem:   0        SAB:   0
 TOP:          0       Ectopic:  0        Living: 0
Gestational Age

 LMP:           33w 2d        Date:  02/26/20                 EDD:   12/02/20
 U/S Today:     29w 6d                                        EDD:   12/26/20
 Best:          33w 2d     Det. By:  LMP  (02/26/20)          EDD:   12/02/20
Anatomy

 Cranium:               Brachycephaly          Aortic Arch:            Previously seen
 Cavum:                 Appears normal         Ductal Arch:            Previously seen
 Ventricles:            Ventriculomegaly       Diaphragm:              Previously seen
                        10mm
 Choroid Plexus:        Previously seen        Stomach:                Appears normal, left
                                                                       sided
 Cerebellum:            Previously seen        Abdomen:                Previously seen
 Posterior Fossa:       Previously seen        Abdominal Wall:         Previously seen
 Nuchal Fold:           Previously seen        Cord Vessels:           Previously seen
 Face:                  previously seen        Kidneys:                Appear normal
 Lips:                  Previously seen        Bladder:                Appears normal
 Thoracic:              Previously seen        Spine:                  Previously seen
 Heart:                 Previously seen        Upper Extremities:      Previously seen
 RVOT:                  Previously seen        Lower Extremities:      Previously seen
 LVOT:                  Previously seen

 Other:  Heels and Open hands previously visualized. Technically difficult due
         to advanced GA and fetal position.
Doppler - Fetal Vessels

 Umbilical Artery
  S/D     %tile      RI    %tile      PI    %tile            ADFV    RDFV
  5.18   > 97.5    0.81   > 97.5    1.63   > 97.5               No      No

Cervix Uterus Adnexa

 Cervix
 Not visualized (advanced GA >47wks)

 Uterus
 Appears normal

 Right Ovary
 Not visualized

 Left Ovary
 Not visualized

 Cul De Sac
 No free fluid seen.

 Adnexa
 Appears normal
Comments

 Viteazarubin Manolii was seen for a follow up growth scan due to
 fetal growth restriction noted during her prior ultrasound
 exams.  Her pregnancy has been complicated by a positive
 cell free DNA test showing an increased risk of Down
 syndrome.  The fetus a complete balanced AV canal defect
 with a left-sided superior vena cava.  The pediatric
 cardiologist ([HOSPITAL]) has stated that the patient may deliver
 here at [HOSPITAL].  She denies any problems since her last
 exam and reports feeling vigorous fetal movements
 throughout the day.
 On today's exam, the EFW measures at the less than the first
 percentile for her gestational age indicating severe fetal
 growth restriction.  The fetus has grown a little over half a
 pound over the past 3 weeks.  There was normal amniotic
 fluid noted.
 A biophysical profile performed today due to fetal growth
 restriction was [DATE].  She received a -2 for fetal
 breathing movements that did not meet criteria.  The patient
 subsequently had a nonreactive NST with mild variable
 decelerations, making her total biophysical profile score [DATE].
 Doppler studies of the umbilical arteries showed an elevated
 S/D ratio of 5.18.  There were no signs of absent or reversed
 end-diastolic flow.
 Due to severe fetal growth restriction and concerns regarding
 the fetal status, the patient was sent to the hospital following
 today's exam for admission, continuous monitoring, to receive
 a course of antenatal corticosteroids, and possibly delivery.
 A total of 20 minutes was spent counseling and coordinating
 the care for this patient.  Greater than 50% of the time spent
 in direct face-to-face contact.
Impression

  weekly

## 2022-05-07 ENCOUNTER — Ambulatory Visit (HOSPITAL_COMMUNITY)
Admission: EM | Admit: 2022-05-07 | Discharge: 2022-05-07 | Disposition: A | Discharge status: Home / Self Care | Payer: Medicaid Other | Attending: Internal Medicine | Admitting: Internal Medicine

## 2022-05-07 ENCOUNTER — Encounter (HOSPITAL_COMMUNITY): Payer: Self-pay

## 2022-05-07 DIAGNOSIS — K0889 Other specified disorders of teeth and supporting structures: Secondary | ICD-10-CM | POA: Diagnosis not present

## 2022-05-07 MED ORDER — IBUPROFEN 800 MG PO TABS
800.0000 mg | ORAL_TABLET | Freq: Once | ORAL | Status: AC
  Administered 2022-05-07: 800 mg via ORAL

## 2022-05-07 MED ORDER — IBUPROFEN 800 MG PO TABS
ORAL_TABLET | ORAL | Status: AC
  Filled 2022-05-07: qty 1

## 2022-05-07 NOTE — ED Triage Notes (Signed)
2-day h/o dental pain and pressure. Pt saw a dentist a couple of days ago.

## 2022-05-07 NOTE — Discharge Instructions (Signed)
Your dental pain and inflammation are likely a result of your recent dental cleaning.  There are no signs of infection to your exam.  I would like for you to take ibuprofen 600 mg every 6 hours as needed for pain and inflammation.   You may use salt water gargles as well to help with inflammation.   Eat soft foods to avoid further pain.  If you develop any new or worsening symptoms or do not improve in the next 2 to 3 days, please return.  If your symptoms are severe, please go to the emergency room.  Follow-up with your primary care provider for further evaluation and management of your symptoms as well as ongoing wellness visits.  I hope you feel better!

## 2022-05-07 NOTE — ED Provider Notes (Signed)
[facility]    CSN: [id number] Arrival date & time: 05/07/22  1007      History   Chief Complaint Chief Complaint  Patient presents with   Dental Pain    HPI Michelle Garrison is a 32 y.o. female.   Patient presents to urgent care for evaluation of dental pain to the left posterior mouth on the lower side that started 2 days ago after routine dental cleaning.  She has a history of infected wisdom tooth to the area and is very concerned that she may have an infection to this tooth after the cleaning.  She did notice some tan discharge to the gum after pressing on the tooth when inspecting at home.  She has not had any fever, chills, sore throat, headache, ear pain, or recent dental surgeries.  This is never happened after a dental cleaning in the past.  She has not had any recent antibiotics or steroids.  She is not a smoker and denies drug use.  Last menstrual cycle was April 24, 2022.    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 Authorizing Provider  ibuprofen (ADVIL) 600 MG tablet Take 1  tablet (600 mg total) by mouth every 6 (six) hours. Patient not taking: No sig reported 11/01/20   Tamala Julian, [place], CNM  lisinopril (ZESTRIL) 5 MG tablet Take 2 tablets (10 mg total) by mouth daily. Patient not taking: Reported on 03/04/2021 11/02/20   Manya Silvas, [place]  Prenatal 27-1 MG TABS Take 1 tablet by mouth daily. Patient not taking: Reported on 03/04/2021 04/29/20   [provider]  psyllium (METAMUCIL) 58.6 % powder Take 1 packet by mouth daily as needed (constipation). Patient not taking: No sig reported    [provider]    Family History Family History  Problem Relation Age of Onset   Diabetes Mother    Healthy Father     Social History Social History   Tobacco Use   Smoking status: Never   Smokeless tobacco: Never  Vaping Use   Vaping Use: Never used  Substance Use Topics   Alcohol use: No   Drug use: No     Allergies   Patient has no known allergies.   Review of Systems Review of Systems  Per HPI  Physical Exam Triage Vital Signs ED Triage Vitals  Enc Vitals Group     BP 05/07/22 1026 112/69     Pulse Rate 05/07/22 1026 82     Resp 05/07/22 1026 18     Temp 05/07/22 1026 99.2 F (37.3 C)     Temp Source 05/07/22 1026 Oral     SpO2 05/07/22 1026 98 %     Weight --      Height --      Head Circumference --      Peak Flow --      Pain Score 05/07/22 1030 4     Pain Loc --      Pain Edu? --      Excl. in GC? --    No data found.  Updated Vital Signs BP 112/69 (BP Location: Left Arm)   Pulse 82   Temp 99.2 F (37.3 C) (Oral)   Resp 18   SpO2 98%   Visual Acuity Right Eye Distance:   Left Eye Distance:   Bilateral Distance:    Right Eye Near:   Left Eye Near:    Bilateral Near:     Physical Exam Vitals and nursing note reviewed.  Constitutional:      Appearance: She is not ill-appearing or toxic-appearing.  HENT:     Head: Normocephalic and atraumatic.     Right Ear: Hearing and external ear normal.     Left  Ear: Hearing and external ear normal.     Nose: Nose normal.     Mouth/Throat:     Lips: Pink.     Mouth: Mucous membranes are moist.     Dentition: Does not have dentures. Dental tenderness present. No gingival swelling, dental caries, dental abscesses or gum lesions.     Tongue: No lesions. Tongue does not deviate from midline.     Palate: No mass.     Pharynx: Oropharynx is clear. No pharyngeal swelling, oropharyngeal exudate, posterior oropharyngeal erythema or uvula swelling.     Tonsils: No tonsillar exudate or tonsillar abscesses.      Comments: Dental pain to tooth #17 as indicated above.  No drainage, buccal swelling, warmth, or gingival tenderness.  Slight tenderness to palpation of affected area. Eyes:     General: Lids are normal. Vision grossly intact. Gaze aligned appropriately.     Extraocular Movements: Extraocular movements intact.     Conjunctiva/sclera: Conjunctivae normal.  Cardiovascular:     Rate and Rhythm: Normal rate and regular rhythm.     Heart sounds: Normal heart sounds, S1 normal and S2 normal.  Pulmonary:     Effort: Pulmonary effort is normal. No respiratory distress.     Breath sounds: Normal breath sounds and air entry.  Musculoskeletal:     Cervical back: Neck supple.  Skin:    General: Skin is warm and dry.     Capillary Refill: Capillary refill takes less than 2 seconds.     Findings: No rash.  Neurological:     General: No focal deficit present.     Mental Status: She is alert and oriented to person, place, and time. Mental status is at baseline.     Cranial Nerves: No dysarthria or facial asymmetry.  Psychiatric:        Mood and Affect: Mood normal.        Speech: Speech normal.        Behavior: Behavior normal.        Thought Content:  Thought content normal.        Judgment: Judgment normal.      UC Treatments / Results  Labs (all labs ordered are listed, but only abnormal results are displayed) Labs Reviewed - No data to  display  EKG   Radiology No results found.  Procedures Procedures (including critical care time)  Medications Ordered in UC Medications  ibuprofen (ADVIL) tablet 800 mg (800 mg Oral Given 05/07/22 1059)    Initial Impression / Assessment and Plan / UC Course  I have reviewed the triage vital signs and the nursing notes.  Pertinent labs & imaging results that were available during my care of the patient were reviewed by me and considered in my medical decision making (see chart for details).   1.  Dental pain Dental pain and inflammation are likely as a result of recent dental cleaning, no signs of infection to physical exam.  No indication for oral antibiotics at this time.  Strict urgent care and ER return precautions discussed should symptoms fail to improve with ibuprofen 600 mg every 6 hours and salt water gargles.  She is to eat soft foods to avoid further dental pain.  800 mg of ibuprofen provided in clinic.  Follow-up with dentist recommended should symptoms fail to improve.  She is agreeable with this plan.  Discussed physical exam and available lab work findings in clinic with patient.  Counseled patient regarding appropriate use of medications and potential side effects for all medications recommended or prescribed today. Discussed red flag signs and symptoms of worsening condition,when to call the PCP office, return to urgent care, and when to seek higher level of care in the emergency department. Patient verbalizes understanding and agreement with plan. All questions answered. Patient discharged in stable condition.    Final Clinical Impressions(s) / UC Diagnoses   Final diagnoses:  Pain, dental     Discharge Instructions      Your dental pain and inflammation are likely a result of your recent dental cleaning.  There are no signs of infection to your exam.  I would like for you to take ibuprofen 600 mg every 6 hours as needed for pain and inflammation.   You may  use salt water gargles as well to help with inflammation.   Eat soft foods to avoid further pain.  If you develop any new or worsening symptoms or do not improve in the next 2 to 3 days, please return.  If your symptoms are severe, please go to the emergency room.  Follow-up with your primary care provider for further evaluation and management of your symptoms as well as ongoing wellness visits.  I hope you feel better!     ED Prescriptions   None    PDMP not reviewed this encounter.   Carlisle Beers, [place] 05/07/22 1102

## 2022-10-07 ENCOUNTER — Encounter: Payer: Self-pay | Admitting: Family Medicine

## 2022-10-07 ENCOUNTER — Ambulatory Visit: Payer: Medicaid Other | Admitting: Family Medicine

## 2022-10-07 NOTE — Progress Notes (Signed)
Patient did not keep appointment today. She will be called to reschedule.  

## 2022-10-10 ENCOUNTER — Telehealth: Payer: Self-pay | Admitting: Family Medicine

## 2022-10-10 NOTE — Telephone Encounter (Signed)
-----   Message from Reva Bores, MD sent at 10/07/2022  7:43 PM EDT ----- Please call and reschedule pt.

## 2022-10-10 NOTE — Telephone Encounter (Signed)
Attempted to reach patient to schedule an appointment with Dr. Shawnie Pons. Left a voicemail message for her to call the office if she had any questions.

## 2022-11-24 ENCOUNTER — Encounter: Payer: Self-pay | Admitting: Family Medicine

## 2022-11-24 ENCOUNTER — Ambulatory Visit: Payer: Medicaid Other | Admitting: Family Medicine

## 2022-11-24 NOTE — Progress Notes (Signed)
Patient did not keep appointment today. She will be called to reschedule.  

## 2024-03-01 ENCOUNTER — Encounter (HOSPITAL_COMMUNITY): Payer: Self-pay

## 2024-03-01 ENCOUNTER — Ambulatory Visit (HOSPITAL_COMMUNITY)
Admission: EM | Admit: 2024-03-01 | Discharge: 2024-03-01 | Disposition: A | Discharge status: Home / Self Care | Attending: Emergency Medicine | Admitting: Emergency Medicine

## 2024-03-01 DIAGNOSIS — H6123 Impacted cerumen, bilateral: Secondary | ICD-10-CM

## 2024-03-01 DIAGNOSIS — R112 Nausea with vomiting, unspecified: Secondary | ICD-10-CM | POA: Diagnosis not present

## 2024-03-01 DIAGNOSIS — N926 Irregular menstruation, unspecified: Secondary | ICD-10-CM

## 2024-03-01 LAB — POCT URINALYSIS DIP (MANUAL ENTRY)
Glucose, UA: NEGATIVE mg/dL
Leukocytes, UA: NEGATIVE
Nitrite, UA: NEGATIVE
Spec Grav, UA: 1.025 (ref 1.010–1.025)
Urobilinogen, UA: 1 U/dL
pH, UA: 5.5 (ref 5.0–8.0)

## 2024-03-01 LAB — POCT URINE PREGNANCY: Preg Test, Ur: NEGATIVE

## 2024-03-01 MED ORDER — ONDANSETRON 4 MG PO TBDP: 4.0000 mg | ORAL_TABLET | Freq: Three times a day (TID) | ORAL | 0 refills | Status: AC | PRN

## 2024-03-01 MED ORDER — ONDANSETRON HCL 4 MG/2ML IJ SOLN
4.0000 mg | Freq: Once | INTRAMUSCULAR | Status: AC
  Administered 2024-03-01: 4 mg via INTRAMUSCULAR

## 2024-03-01 MED ORDER — ONDANSETRON HCL 4 MG/2ML IJ SOLN
INTRAMUSCULAR | Status: AC
  Filled 2024-03-01: qty 2

## 2024-03-01 NOTE — Discharge Instructions (Signed)
 For earwax buildup, please purchase Debrox at your pharmacy.  If you have difficulty finding on the shelf, please asked the pharmacist to show you where it is.  I recommend that you put 3 to 4 drops in each ear daily for the next week or so.  After this, you can either attempt to use the bulb syringe that comes with the Debrox to clean out your ears or you can return here to urgent care to have it done.  For nausea, I provided you with a prescription for Zofran  that you can take every 8 hours as needed.  Your next dose will be due around 4 AM tomorrow morning.  Thank you for visiting [facility] today.  We appreciate the opportunity to be dissipating your care.

## 2024-03-01 NOTE — ED Notes (Signed)
Discharged by RN

## 2024-03-01 NOTE — ED Provider Notes (Addendum)
 [facility]    CSN: [id number] Arrival date & time: 03/01/24  1905    HISTORY   Chief Complaint  Patient presents with   Emesis   HPI Michelle Garrison is a pleasant, 33 y.o. female who presents to urgent care today. Patient complains of a 2-3 history of dizziness, vision changes, limited ability to walk, nausea with vomiting.  Patient states the room has felt like it is spinning, states that this has improved today.  Patient denies diarrhea, headache, fever, chills, body aches, nasal congestion, rhinorrhea, sore throat, cough.  Patient denies known sick contacts.  Patient states she has not been able to tolerate food but has been able to eat keep small amounts of liquid down.  Blood pressure is quite elevated on arrival today with a normal heart rate.  Urine dip today reveals high levels of protein and ketones in urine.  The history is provided by the patient.  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   Hemorrhoids, external, thrombosed 01/20/2013   Past Surgical History:  Procedure Laterality Date   MINOR HEMORRHOIDECTOMY  01/12/2013   I&D recurrent thrombosed hemorrhoid   OB History     Gravida  1   Para  1   Term  0   Preterm  1   AB  0   Living  1      SAB  0   IAB  0   Ectopic  0   Multiple  0   Live Births  1          Home Medications    Prior to Admission medications   Not on File    Family History Family History  Problem Relation Age of Onset   Diabetes Mother    Healthy Father    Social History Social History    Tobacco Use   Smoking status: Never   Smokeless tobacco: Never  Vaping Use   Vaping status: Never Used  Substance Use Topics   Alcohol use: No   Drug use: No   Allergies   Patient has no known allergies.  Review of Systems Review of Systems  Gastrointestinal:  Positive for vomiting.   Pertinent findings revealed after performing a 14 point review of systems has been noted in the history of present illness.  Physical Exam Vital Signs BP (!) 146/111 (BP Location: Left Arm)   Pulse 80   Temp 98.2 F (36.8 C) (Oral)   Resp 18   Ht 5' 6 (1.676 m)   Wt 138 lb (62.6 kg)   LMP 01/25/2024 (Approximate)   SpO2 95%   Breastfeeding No   BMI 22.27 kg/m   No data found.  Physical Exam Vitals and nursing note reviewed.  Constitutional:      General: She is awake. She is not in acute distress.    Appearance: Normal appearance. She is well-developed and well-groomed. She is not ill-appearing.  HENT:     Head: Normocephalic and atraumatic.  Salivary Glands: Right salivary gland is not diffusely enlarged or tender. Left salivary gland is not diffusely enlarged or tender.     Right Ear: Hearing, tympanic membrane, ear canal and external ear normal.     Left Ear: Hearing, tympanic membrane, ear canal and external ear normal.     Nose: Nose normal.     Right Turbinates: Not enlarged, swollen or pale.     Left Turbinates: Not enlarged, swollen or pale.     Right Sinus: No maxillary sinus tenderness or frontal sinus tenderness.     Left Sinus: No maxillary sinus tenderness or frontal sinus tenderness.     Mouth/Throat:     Lips: Pink. No lesions.     Mouth: Mucous membranes are moist. No oral lesions.     Tongue: No lesions. Tongue does not deviate from midline.     Palate: No mass and lesions.     Pharynx: Oropharynx is clear. Uvula midline. No pharyngeal swelling, oropharyngeal exudate, posterior oropharyngeal erythema, uvula swelling or postnasal drip.     Tonsils: No  tonsillar exudate. 0 on the right. 0 on the left.  Eyes:     General: Lids are normal.        Right eye: No discharge.        Left eye: No discharge.     Conjunctiva/sclera: Conjunctivae normal.     Right eye: Right conjunctiva is not injected.     Left eye: Left conjunctiva is not injected.  Neck:     Trachea: Trachea and phonation normal.  Cardiovascular:     Rate and Rhythm: Normal rate and regular rhythm.  Pulmonary:     Effort: Pulmonary effort is normal.     Breath sounds: Normal breath sounds.  Chest:     Chest wall: No tenderness.  Musculoskeletal:        General: Normal range of motion.     Cervical back: Full passive range of motion without pain, normal range of motion and neck supple. Normal range of motion.  Lymphadenopathy:     Cervical: No cervical adenopathy.  Skin:    General: Skin is warm and dry.     Findings: No erythema or rash.  Neurological:     General: No focal deficit present.     Mental Status: She is alert and oriented to person, place, and time. Mental status is at baseline.  Psychiatric:        Attention and Perception: Attention and perception normal.        Mood and Affect: Mood and affect normal.        Speech: Speech normal.        Behavior: Behavior normal. Behavior is cooperative.        Thought Content: Thought content normal.     Visual Acuity Right Eye Distance:   Left Eye Distance:   Bilateral Distance:    Right Eye Near:   Left Eye Near:    Bilateral Near:     UC Couse / Diagnostics / Procedures:     Radiology No results found.  Procedures Procedures (including critical care time) EKG  Pending results:  Labs Reviewed  POCT URINALYSIS DIP (MANUAL ENTRY) - Abnormal; Notable for the following components:      Result Value   Color, UA straw (*)    Clarity, UA cloudy (*)    Bilirubin, UA small (*)    Ketones, POC UA small (15) (*)    Blood, UA trace-intact (*)  Protein Ur, POC trace (*)    All other components  within normal limits  POCT URINE PREGNANCY    Medications Ordered in UC: Medications  ondansetron  (ZOFRAN ) injection 4 mg (4 mg Intramuscular Given 03/01/24 2005)    UC Diagnoses / Final Clinical Impressions(s)   I have reviewed the triage vital signs and the nursing notes.  Pertinent labs & imaging results that were available during my care of the patient were reviewed by me and considered in my medical decision making (see chart for details).    Final diagnoses:  Menstrual periods irregular  Nausea and vomiting, unspecified vomiting type  Bilateral impacted cerumen   Physical exam today is unremarkable.  Urine pregnancy test today was negative.  Patient provided with an injection of Zofran  during her visit which provided significant relief after about 30 minutes.  Patient provided with a prescription for Zofran  to take at home 8 hours apart.  Gentle oral electrolyte fluid rehydration encouraged.  Return precautions advised.  Please see discharge instructions below for details of plan of care as provided to patient. ED Prescriptions     Medication Sig Dispense Auth. Provider   ondansetron  (ZOFRAN -ODT) 4 MG disintegrating tablet Take 1 tablet (4 mg total) by mouth every 8 (eight) hours as needed for nausea or vomiting. 20 tablet Joesph Shaver Scales, PA-C      PDMP not reviewed this encounter.  Pending results:  Labs Reviewed  POCT URINALYSIS DIP (MANUAL ENTRY) - Abnormal; Notable for the following components:      Result Value   Color, UA straw (*)    Clarity, UA cloudy (*)    Bilirubin, UA small (*)    Ketones, POC UA small (15) (*)    Blood, UA trace-intact (*)    Protein Ur, POC trace (*)    All other components within normal limits  POCT URINE PREGNANCY      Discharge Instructions      For earwax buildup, please purchase Debrox at your pharmacy.  If you have difficulty finding on the shelf, please asked the pharmacist to show you where it is.  I recommend that  you put 3 to 4 drops in each ear daily for the next week or so.  After this, you can either attempt to use the bulb syringe that comes with the Debrox to clean out your ears or you can return here to urgent care to have it done.  For nausea, I provided you with a prescription for Zofran  that you can take every 8 hours as needed.  Your next dose will be due around 4 AM tomorrow morning.  Thank you for visiting [facility] today.  We appreciate the opportunity to be dissipating your care.      Disposition Upon Discharge:  Condition: stable for discharge home  Patient presented with an acute illness with associated systemic symptoms and significant discomfort requiring urgent management. In my opinion, this is a condition that a prudent lay person (someone who possesses an average knowledge of health and medicine) may potentially expect to result in complications if not addressed urgently such as respiratory distress, impairment of bodily function or dysfunction of bodily organs.   Routine symptom specific, illness specific and/or disease specific instructions were discussed with the patient and/or caregiver at length.   As such, the patient has been evaluated and assessed, work-up was performed and treatment was provided in alignment with urgent care protocols and evidence based medicine.  Patient/parent/caregiver has been advised that  the patient may require follow up for further testing and treatment if the symptoms continue in spite of treatment, as clinically indicated and appropriate.  Patient/parent/caregiver has been advised to return to the [facility] or PCP if no better; to PCP or the Emergency Department if new signs and symptoms develop, or if the current signs or symptoms continue to change or worsen for further workup, evaluation and treatment as clinically indicated and appropriate  The patient will follow up with their current PCP if and as advised. If the patient does not  currently have a PCP we will assist them in obtaining one.   The patient may need specialty follow up if the symptoms continue, in spite of conservative treatment and management, for further workup, evaluation, consultation and treatment as clinically indicated and appropriate.  Patient/parent/caregiver verbalized understanding and agreement of plan as discussed.  All questions were addressed during visit.  Please see discharge instructions below for further details of plan.  This office note has been dictated using Teaching laboratory technician.  Unfortunately, this method of dictation can sometimes lead to typographical or grammatical errors.  I apologize for your inconvenience in advance if this occurs.  Please do not hesitate to reach out to me if clarification is needed.      Joesph Shaver Scales, PA-C 03/03/24 0740    Joesph Shaver Scales, PA-C 03/03/24 [id number]

## 2024-03-01 NOTE — ED Triage Notes (Signed)
 Onset 2-3 days ago woke up dizzy, vision changes, could not walk, nausea with emesis. States felt like the room was spinning. States the nausea is ongoing while the dizziness comes and goes. Denies any other sick symptoms.   Denies any one with similar symptoms or known exposure. No dietary or medication changes.

## 2024-03-06 ENCOUNTER — Ambulatory Visit (HOSPITAL_COMMUNITY): Admission: EM | Admit: 2024-03-06 | Discharge: 2024-03-06 | Disposition: A | Discharge status: Home / Self Care

## 2024-03-06 VITALS — BP 140/90 | HR 64 | Temp 98.0°F

## 2024-03-06 DIAGNOSIS — H6123 Impacted cerumen, bilateral: Secondary | ICD-10-CM

## 2024-03-06 NOTE — ED Triage Notes (Signed)
 Pt states she was seen on 10/31 for ear fullness. She used the debrox solution as advised. States the fullness has gotten worse since then. She denies pain. Last used drops this am.

## 2024-03-06 NOTE — Discharge Instructions (Addendum)
 Advised patient if symptoms worsen and or unresolved please follow-up with your PCP, ENT, or here for further evaluation.

## 2024-03-06 NOTE — ED Provider Notes (Signed)
 [facility]    CSN: [id number] Arrival date & time: 03/06/24  0902      History   Chief Complaint Chief Complaint  Patient presents with   Ear Fullness    Entered by patient    HPI Michelle Garrison is a 33 y.o. female.   HPI 33 year old female presents with bilateral ear fullness for 8 days.  PMH significant for HTN, sickle cell trait, and dysplasia of cervix, high-grade CIN II.  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 Authorizing Provider  ondansetron  (ZOFRAN -ODT) 4 MG disintegrating tablet Take 1 tablet (4 mg total) by mouth every 8 (eight) hours as needed for nausea or vomiting. 03/01/24   Joesph Shaver Scales, PA-C    Family History Family History  Problem Relation Age of Onset   Diabetes Mother    Healthy Father     Social History Social History   Tobacco Use   Smoking status: Never   Smokeless tobacco: Never  Vaping Use   Vaping status: Never Used  Substance Use Topics   Alcohol use: No   Drug use: No     Allergies   Patient has no known allergies.   Review of  Systems Review of Systems  All other systems reviewed and are negative.    Physical Exam Triage Vital Signs ED Triage Vitals  Encounter Vitals Group     BP      Girls Systolic BP Percentile      Girls Diastolic BP Percentile      Boys Systolic BP Percentile      Boys Diastolic BP Percentile      Pulse      Resp      Temp      Temp src      SpO2      Weight      Height      Head Circumference      Peak Flow      Pain Score      Pain Loc      Pain Education      Exclude from Growth Chart    No data found.  Updated Vital Signs BP (!) 140/90 (BP Location: Right Arm)   Pulse 64   Temp 98 F (36.7 C)   LMP  01/25/2024 (Approximate)   SpO2 98%    Physical Exam Vitals and nursing note reviewed.  Constitutional:      General: She is not in acute distress.    Appearance: Normal appearance. She is normal weight. She is not ill-appearing.  HENT:     Head: Normocephalic and atraumatic.     Right Ear: External ear normal.     Left Ear: External ear normal.     Ears:     Comments: Bilateral EAC's occluded with excessive cerumen unable to visualize either TM.  Post bilateral ear lavage: Left EAC-clear, Left TM-clear, mobile with good light reflex; Right EAC-clear, Right TM-clear, mobile with good light reflex    Mouth/Throat:     Mouth: Mucous membranes are moist.     Pharynx: Oropharynx is clear.  Eyes:     Extraocular Movements: Extraocular movements intact.     Conjunctiva/sclera: Conjunctivae normal.     Pupils: Pupils are equal, round, and reactive to light.  Cardiovascular:     Rate and Rhythm: Normal rate and regular rhythm.     Pulses: Normal pulses.     Heart sounds: Normal heart sounds.  Pulmonary:     Effort: Pulmonary effort is normal.     Breath sounds: Normal breath sounds.  Musculoskeletal:        General: Normal range of motion.  Skin:    General: Skin is warm and dry.  Neurological:     General: No focal deficit present.     Mental Status: She is  alert and oriented to person, place, and time. Mental status is at baseline.  Psychiatric:        Mood and Affect: Mood normal.        Behavior: Behavior normal.      UC Treatments / Results  Labs (all labs ordered are listed, but only abnormal results are displayed) Labs Reviewed - No data to display  EKG   Radiology No results found.  Procedures Procedures (including critical care time)  Medications Ordered in UC Medications - No data to display  Initial Impression / Assessment and Plan / UC Course  I have reviewed the triage vital signs and the nursing notes.  Pertinent labs & imaging results that were available during my care of the patient were reviewed by me and considered in my medical decision making (see chart for details).     MDM: 1.  Excessive cerumen in both ear canals-resolved with bilateral ear lavage. Advised patient if symptoms worsen and/or unresolved please follow-up with your PCP, ENT, or here for further evaluation.  Patient discharged home, hemodynamically stable.  Final Clinical Impressions(s) / UC Diagnoses   Final diagnoses:  Excessive cerumen in both ear canals     Discharge Instructions      Advised patient if symptoms worsen and/or unresolved please follow-up with your PCP, ENT, or here for further evaluation.     ED Prescriptions   None    PDMP not reviewed this encounter.   Teddy Sharper, FNP 03/06/24 1025
